# Patient Record
Sex: Male | Born: 1970 | Race: Black or African American | Hispanic: No | Marital: Married | Smoking: Never smoker
Health system: Southern US, Community
[De-identification: ages and names within clinical notes are randomized; demographics above are authoritative.]

## PROBLEM LIST (undated history)

## (undated) DIAGNOSIS — I10 Essential (primary) hypertension: Secondary | ICD-10-CM

## (undated) DIAGNOSIS — D649 Anemia, unspecified: Secondary | ICD-10-CM

## (undated) DIAGNOSIS — R7303 Prediabetes: Secondary | ICD-10-CM

## (undated) DIAGNOSIS — K760 Fatty (change of) liver, not elsewhere classified: Secondary | ICD-10-CM

## (undated) DIAGNOSIS — M1990 Unspecified osteoarthritis, unspecified site: Secondary | ICD-10-CM

## (undated) DIAGNOSIS — K5732 Diverticulitis of large intestine without perforation or abscess without bleeding: Secondary | ICD-10-CM

## (undated) DIAGNOSIS — E785 Hyperlipidemia, unspecified: Secondary | ICD-10-CM

## (undated) DIAGNOSIS — K219 Gastro-esophageal reflux disease without esophagitis: Secondary | ICD-10-CM

## (undated) DIAGNOSIS — Z8719 Personal history of other diseases of the digestive system: Secondary | ICD-10-CM

## (undated) DIAGNOSIS — N912 Amenorrhea, unspecified: Secondary | ICD-10-CM

## (undated) DIAGNOSIS — Z5189 Encounter for other specified aftercare: Secondary | ICD-10-CM

## (undated) HISTORY — PX: VEIN LIGATION AND STRIPPING: SHX2653

## (undated) HISTORY — DX: Encounter for other specified aftercare: Z51.89

## (undated) HISTORY — PX: OTHER SURGICAL HISTORY: SHX169

## (undated) HISTORY — DX: Personal history of other diseases of the digestive system: Z87.19

## (undated) HISTORY — DX: Hyperlipidemia, unspecified: E78.5

## (undated) HISTORY — DX: Unspecified osteoarthritis, unspecified site: M19.90

## (undated) HISTORY — DX: Diverticulitis of large intestine without perforation or abscess without bleeding: K57.32

## (undated) HISTORY — PX: POLYPECTOMY: SHX149

## (undated) HISTORY — PX: UPPER GASTROINTESTINAL ENDOSCOPY: SHX188

## (undated) HISTORY — DX: Essential (primary) hypertension: I10

## (undated) HISTORY — DX: Fatty (change of) liver, not elsewhere classified: K76.0

## (undated) HISTORY — DX: Anemia, unspecified: D64.9

## (undated) HISTORY — DX: Prediabetes: R73.03

## (undated) HISTORY — DX: Amenorrhea, unspecified: N91.2

## (undated) HISTORY — DX: Gastro-esophageal reflux disease without esophagitis: K21.9

## (undated) HISTORY — PX: WISDOM TOOTH EXTRACTION: SHX21

---

## 1996-05-03 HISTORY — PX: KNEE SURGERY: SHX244

## 2003-05-04 HISTORY — PX: OTHER SURGICAL HISTORY: SHX169

## 2006-05-03 HISTORY — PX: CHOLECYSTECTOMY: SHX55

## 2008-05-31 ENCOUNTER — Inpatient Hospital Stay (HOSPITAL_COMMUNITY): Admission: EM | Admit: 2008-05-31 | Discharge: 2008-06-02 | Payer: Self-pay | Admitting: Emergency Medicine

## 2008-05-31 ENCOUNTER — Encounter (INDEPENDENT_AMBULATORY_CARE_PROVIDER_SITE_OTHER): Payer: Self-pay | Admitting: Internal Medicine

## 2008-05-31 ENCOUNTER — Ambulatory Visit: Payer: Self-pay | Admitting: Internal Medicine

## 2008-06-23 ENCOUNTER — Emergency Department (HOSPITAL_COMMUNITY): Admission: EM | Admit: 2008-06-23 | Discharge: 2008-06-23 | Payer: Self-pay | Admitting: Emergency Medicine

## 2008-07-08 HISTORY — PX: ESOPHAGOGASTRODUODENOSCOPY: SHX1529

## 2010-04-23 ENCOUNTER — Emergency Department (HOSPITAL_COMMUNITY)
Admission: EM | Admit: 2010-04-23 | Discharge: 2010-04-24 | Payer: Self-pay | Source: Home / Self Care | Admitting: Emergency Medicine

## 2010-07-13 LAB — COMPREHENSIVE METABOLIC PANEL
Albumin: 4.4 g/dL (ref 3.5–5.2)
BUN: 5 mg/dL — ABNORMAL LOW (ref 6–23)
CO2: 29 mEq/L (ref 19–32)
Calcium: 9.5 mg/dL (ref 8.4–10.5)
Chloride: 102 mEq/L (ref 96–112)
GFR calc Af Amer: >60 mL/min (ref >60)
GFR calc non Af Amer: >60 mL/min (ref >60)
Potassium: 3.9 mEq/L (ref 3.5–5.1)
Sodium: 139 mEq/L (ref 135–145)
Total Bilirubin: 0.8 mg/dL (ref 0.3–1.2)

## 2010-07-13 LAB — DIFFERENTIAL
Basophils Absolute: 0 10*3/uL (ref 0.0–0.1)
Basophils Relative: 0 % (ref 0–1)
Eosinophils Absolute: 0.2 10*3/uL (ref 0.0–0.7)
Lymphocytes Relative: 29 % (ref 12–46)
Monocytes Absolute: 0.4 10*3/uL (ref 0.1–1.0)
Neutrophils Relative %: 62 % (ref 43–77)

## 2010-07-13 LAB — CBC
HCT: 46.4 % (ref 39.0–52.0)
MCV: 91.9 fL (ref 78.0–100.0)
RDW: 12.5 % (ref 11.5–15.5)
WBC: 6.8 10*3/uL (ref 4.0–10.5)

## 2010-07-31 IMAGING — CR DG CHEST 2V
2 series · 2 of 2 positions shown · non-contrast
Comparison: None available.

CLINICAL DATA: Chest pain.Fever.

CHEST - 2 VIEW

[w chest pa]
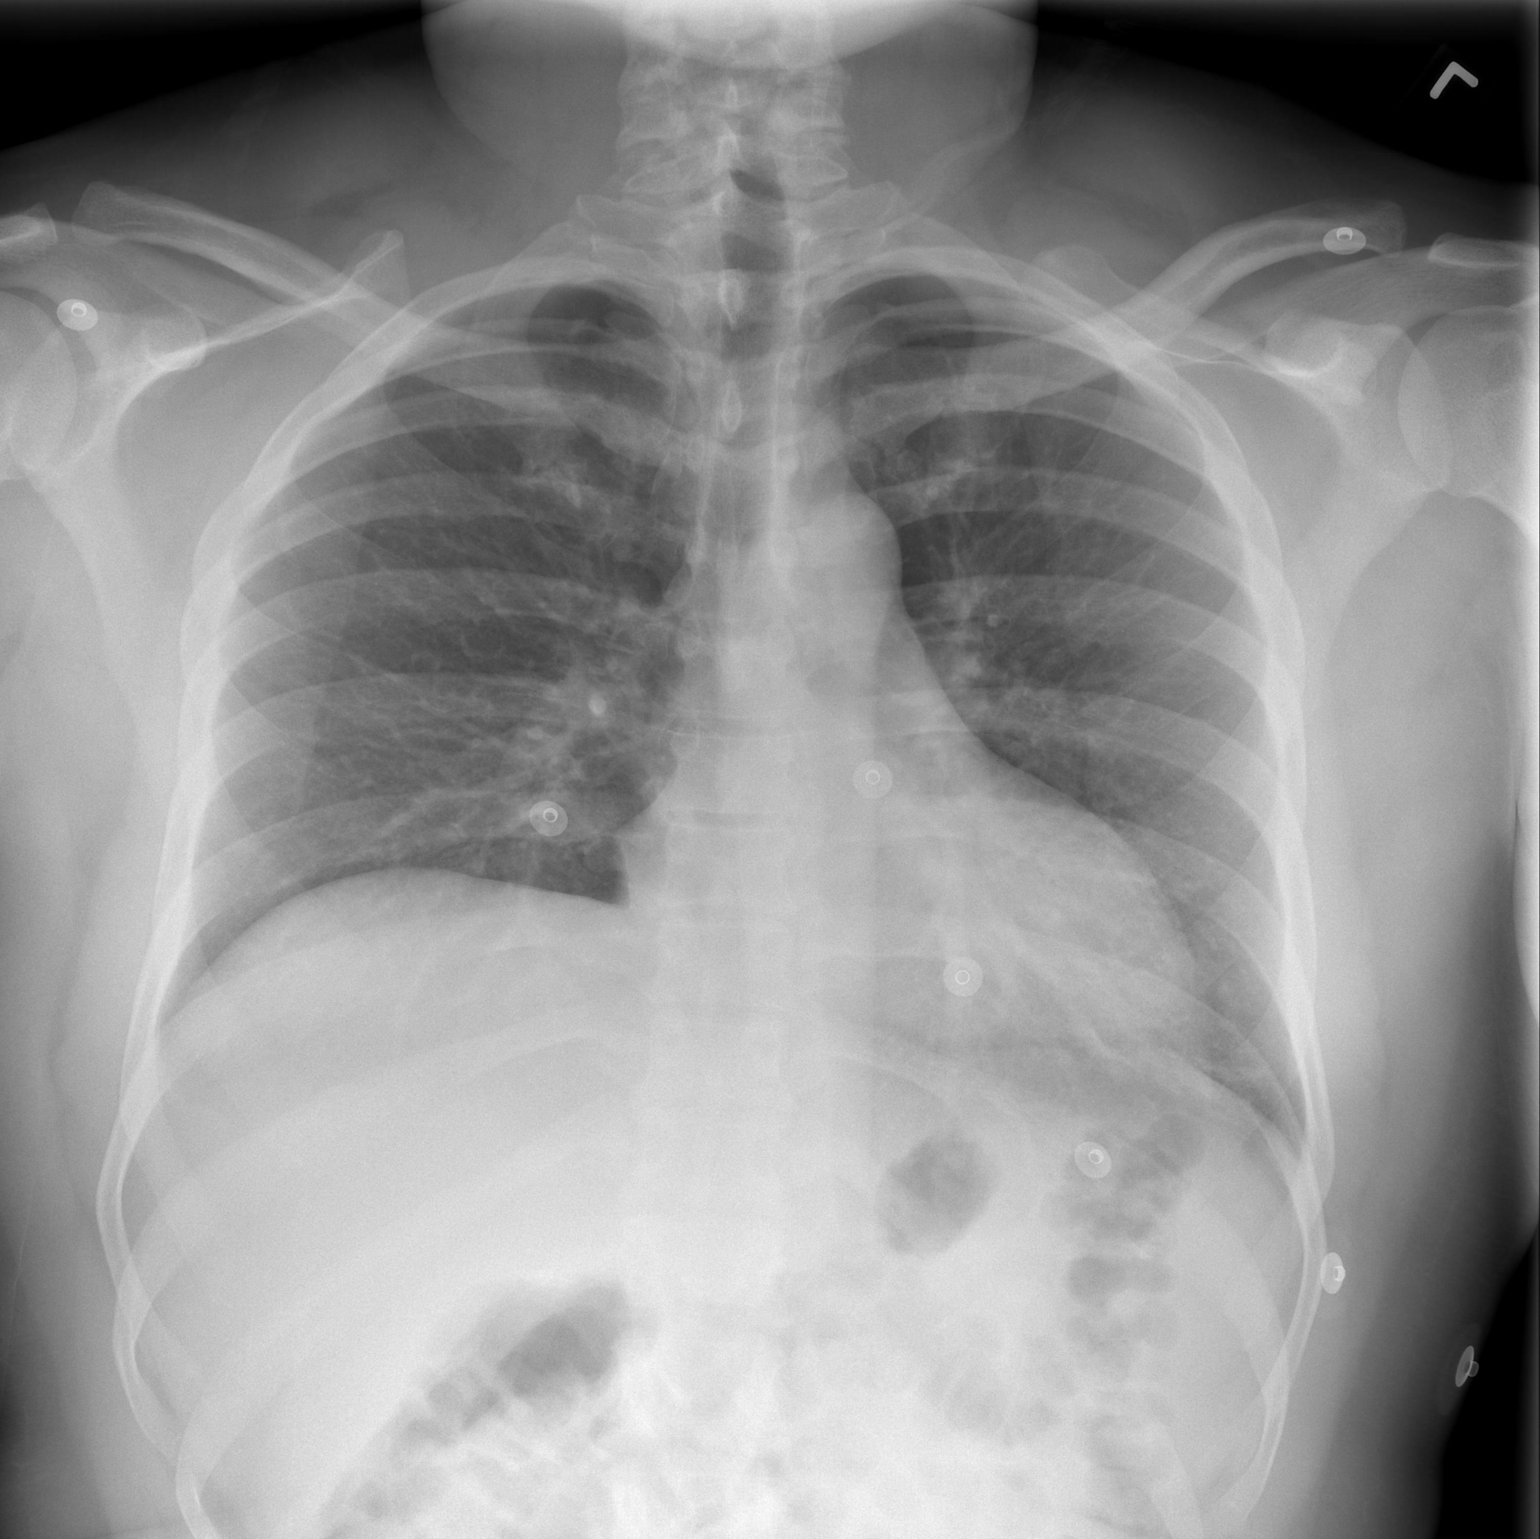

[w chest lat]
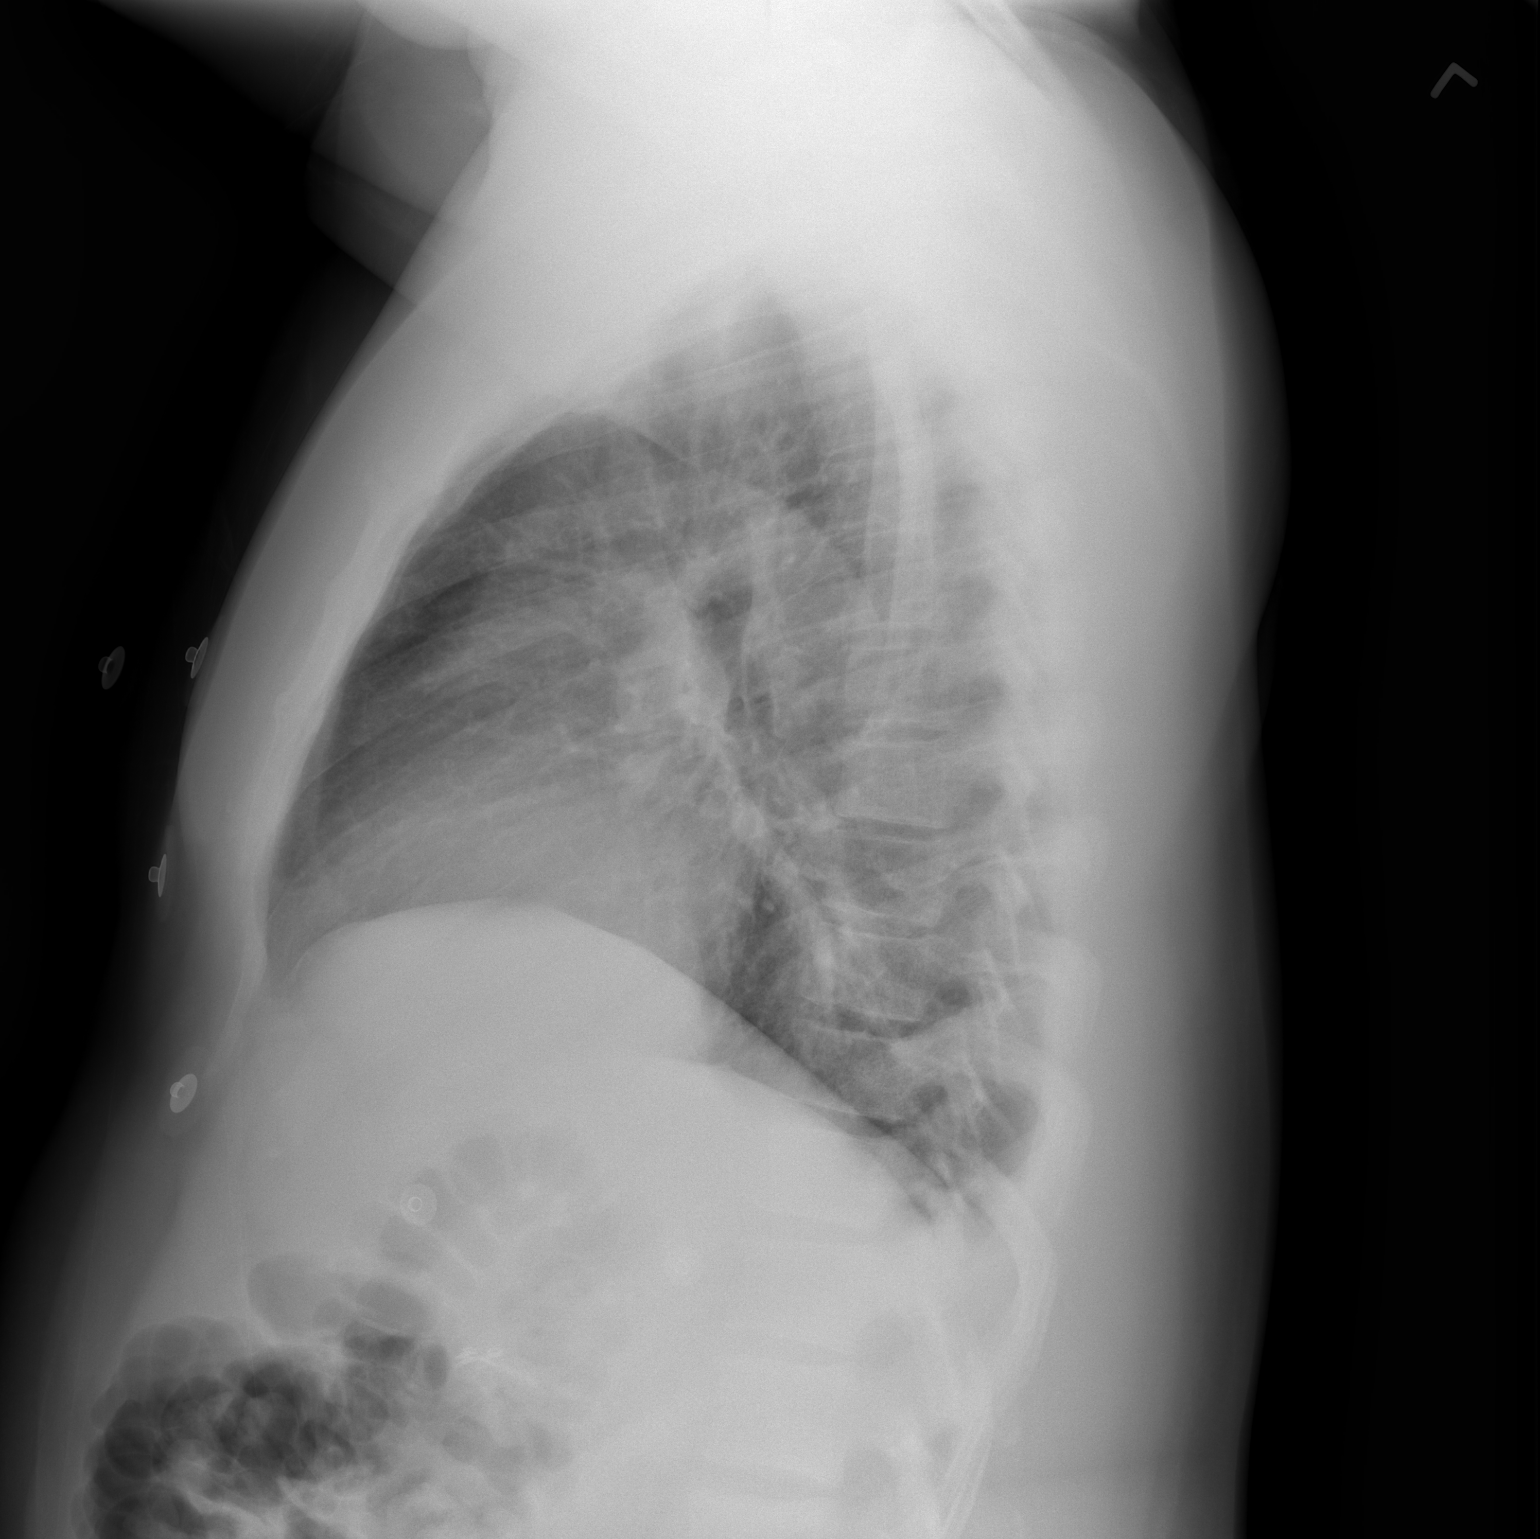

[2 of 2 positions shown; findings below may reference images not displayed]

FINDINGS: The lung volumes are low.  The lungs are clear.  There is
no pleural effusion.  Heart size is normal.
IMPRESSION: No acute disease.

## 2010-07-31 IMAGING — CT CT ANGIO CHEST
2 of 6 series · 20 of 36 positions shown · IV contrast (APPLIED)
Comparison: Chest x-ray 05/30/2008.

CLINICAL DATA: Chest pain.  Rule out pulmonary embolism.

CT ANGIOGRAPHY CHEST
TECHNIQUE: Multidetector CT imaging of the chest using the
standard protocol during bolus administration of intravenous
contrast. Multiplanar reconstructed images including MIPs were
obtained and reviewed to evaluate the vascular anatomy.
Contrast: 80 ml Zmnipaque-G77 IV.

[Series 6: pe 1.0 b40f thins for pacs · axial · 0.68mm/px · z∈[+1324,+1544]mm · 19 of 246 slices shown]
[im 13/246  lung]
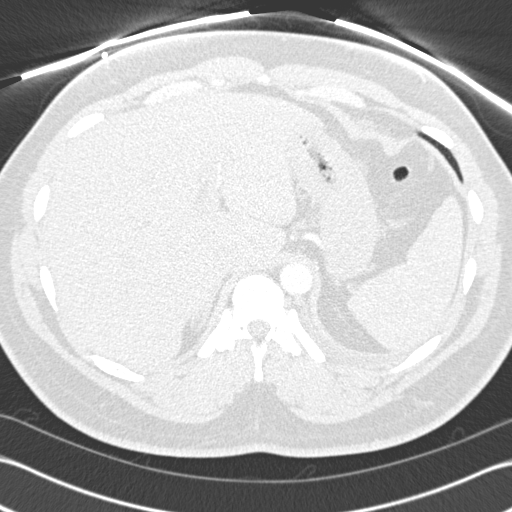
[im 25/246  mediastinal]
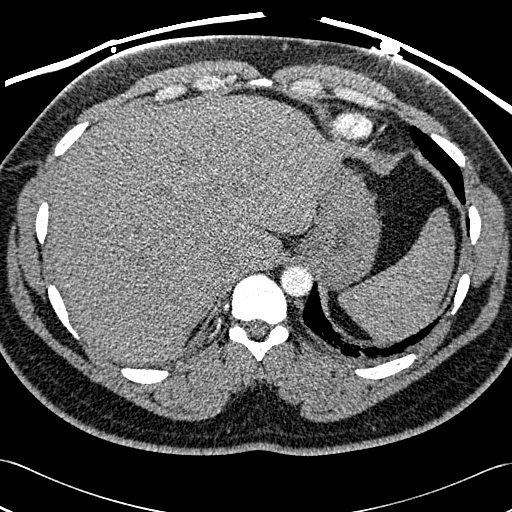
[im 37/246  lung]
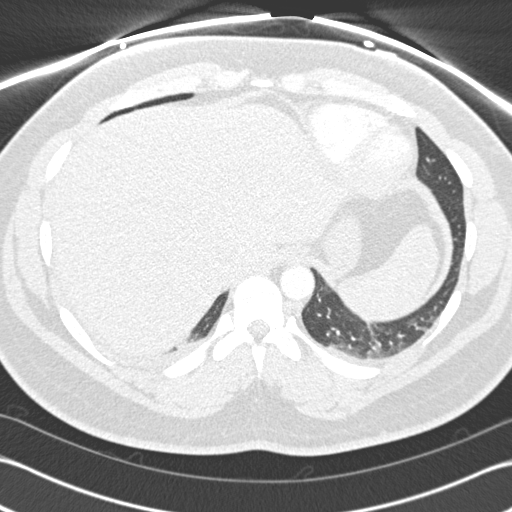
[im 50/246  mediastinal]
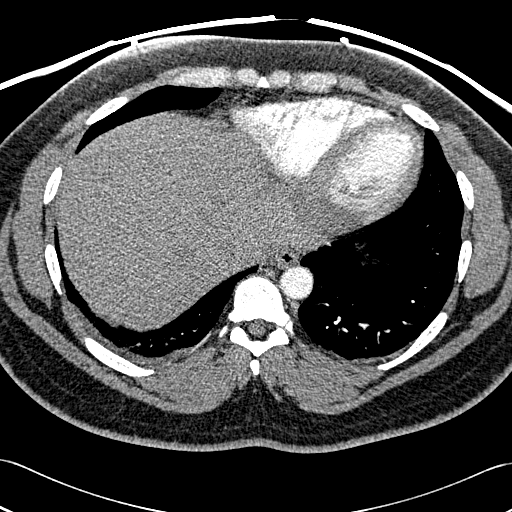
[im 62/246  lung]
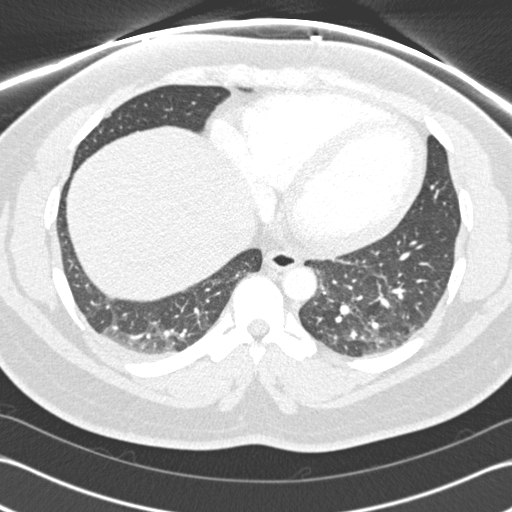
[im 74/246  mediastinal]
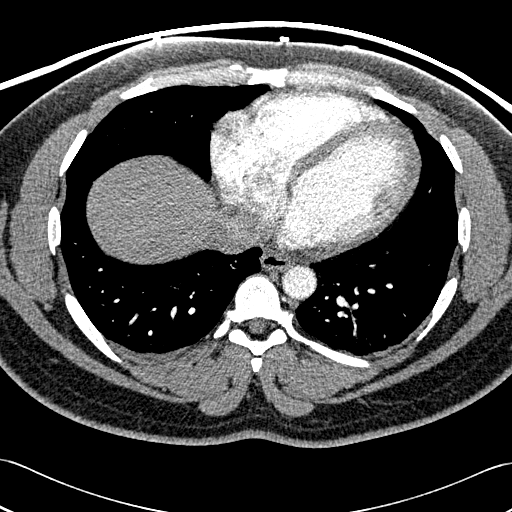
[im 86/246  lung]
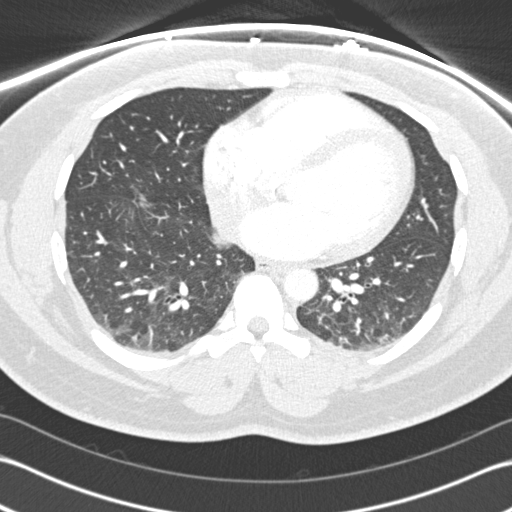
[im 99/246  mediastinal]
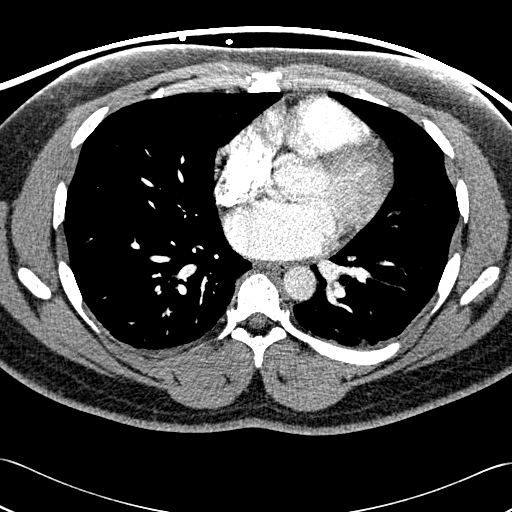
[im 111/246  lung]
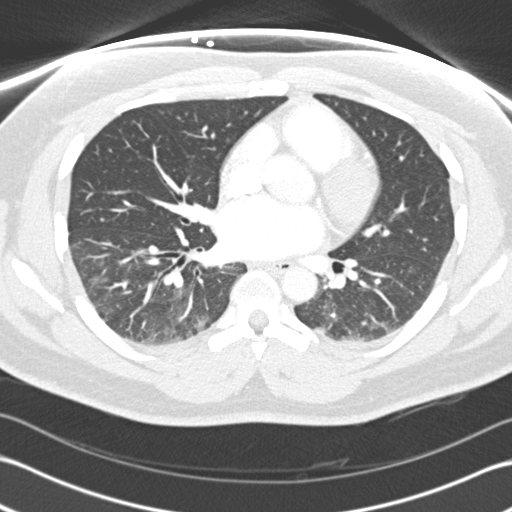
[im 123/246  mediastinal]
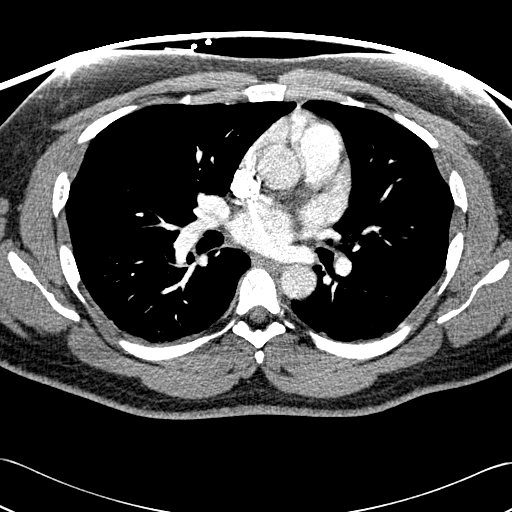
[im 135/246  lung]
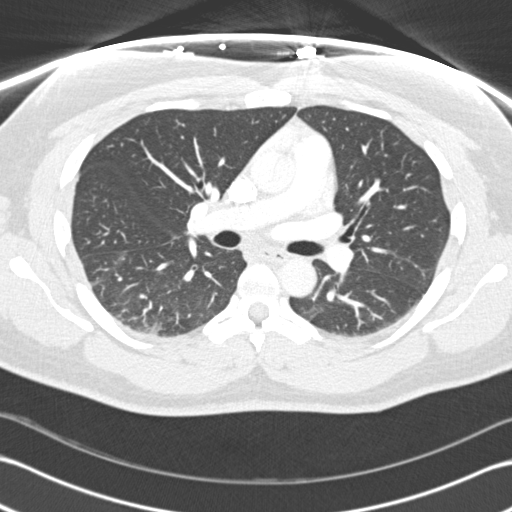
[im 148/246  mediastinal]
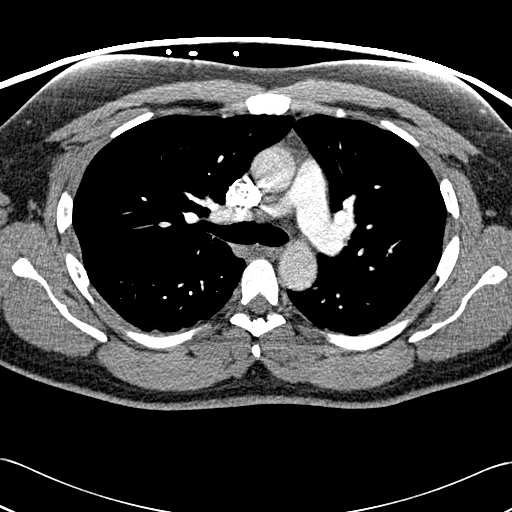
[im 160/246  lung]
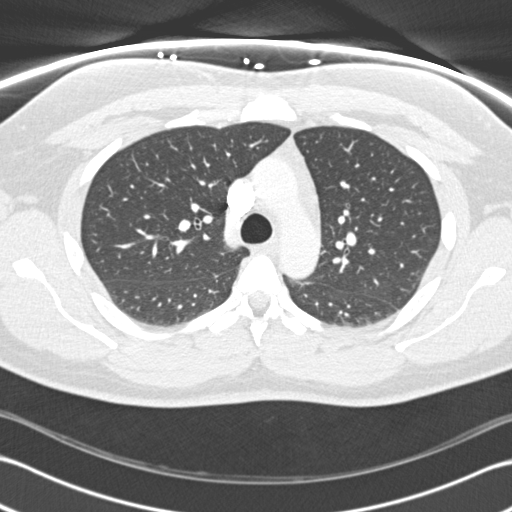
[im 172/246  mediastinal]
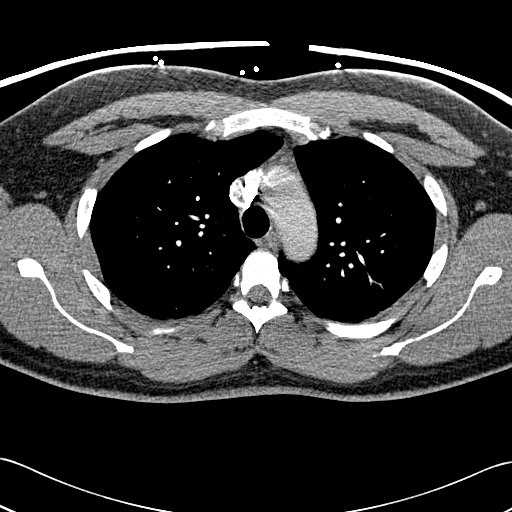
[im 184/246  lung]
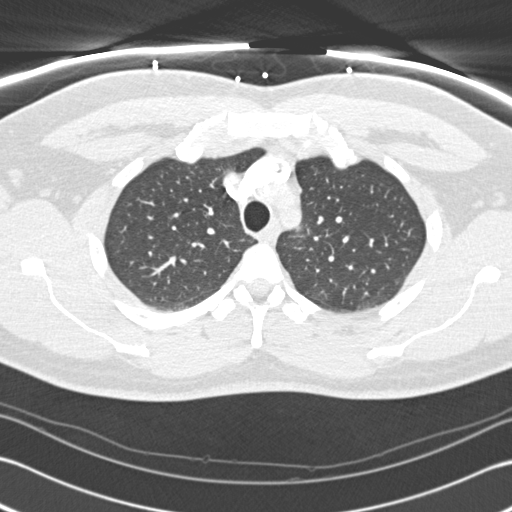
[im 197/246  mediastinal]
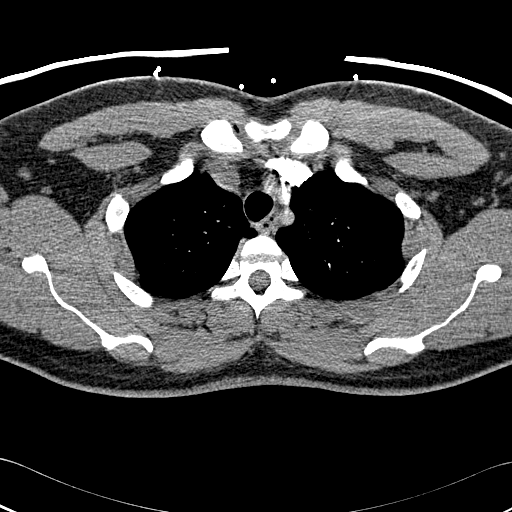
[im 209/246  lung]
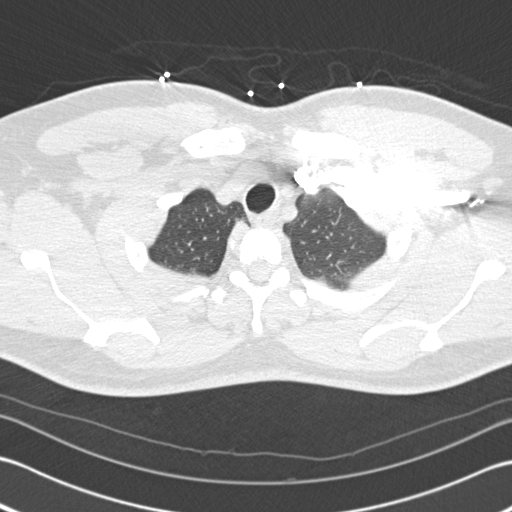
[im 221/246  mediastinal]
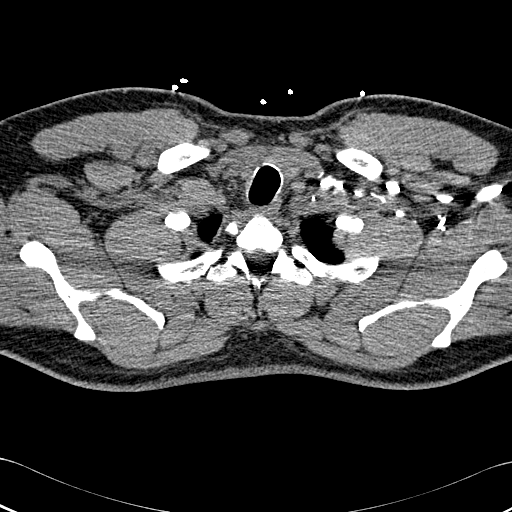
[im 233/246  lung]
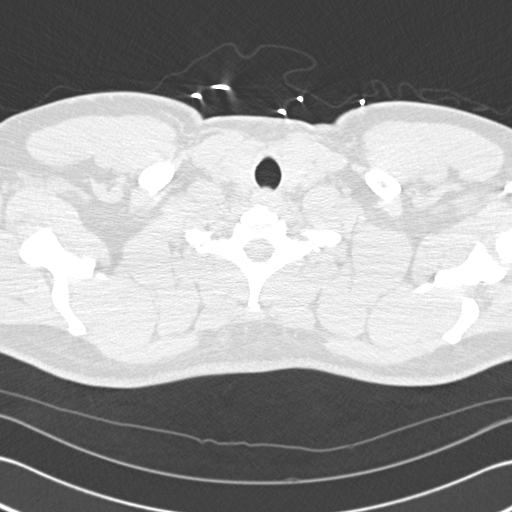

[Series 603: <mpr thick range> · coronal · 0.68mm/px · 1 of 102 slices shown]
[im 51/102  mediastinal]
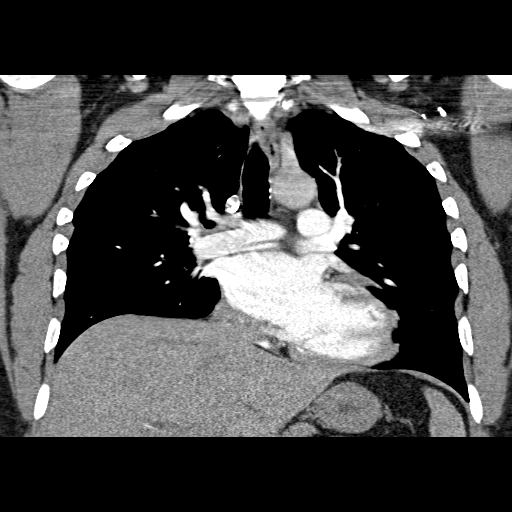

[20 of 36 positions shown; findings below may reference images not displayed]

FINDINGS: Negative for pulmonary embolism.  There is no aortic
dissection or aneurysm.  There is mild dependent atelectasis
bilaterally.  No acute infiltrate.  There is no mass or adenopathy.
IMPRESSION: No acute abnormality.  Negative for pulmonary embolism.

## 2010-08-17 LAB — POCT I-STAT, CHEM 8
BUN: 8 mg/dL (ref 6–23)
Calcium, Ion: 1.12 mmol/L (ref 1.12–1.32)
Chloride: 104 mEq/L (ref 96–112)
Creatinine, Ser: 0.9 mg/dL (ref 0.4–1.5)
HCT: 47 % (ref 39.0–52.0)
Hemoglobin: 16 g/dL (ref 13.0–17.0)
Potassium: 3.6 mEq/L (ref 3.5–5.1)

## 2010-08-17 LAB — COMPREHENSIVE METABOLIC PANEL
ALT: 55 U/L — ABNORMAL HIGH (ref 0–53)
ALT: 57 U/L — ABNORMAL HIGH (ref 0–53)
AST: 34 U/L (ref 0–37)
Albumin: 3.4 g/dL — ABNORMAL LOW (ref 3.5–5.2)
Alkaline Phosphatase: 47 U/L (ref 39–117)
Alkaline Phosphatase: 56 U/L (ref 39–117)
Alkaline Phosphatase: 57 U/L (ref 39–117)
BUN: 8 mg/dL (ref 6–23)
BUN: 9 mg/dL (ref 6–23)
CO2: 27 mEq/L (ref 19–32)
CO2: 28 mEq/L (ref 19–32)
Calcium: 8.5 mg/dL (ref 8.4–10.5)
Calcium: 8.8 mg/dL (ref 8.4–10.5)
Chloride: 100 mEq/L (ref 96–112)
Creatinine, Ser: 0.8 mg/dL (ref 0.4–1.5)
Creatinine, Ser: 0.99 mg/dL (ref 0.4–1.5)
GFR calc non Af Amer: >60 mL/min (ref >60)
Glucose, Bld: 142 mg/dL — ABNORMAL HIGH (ref 70–99)
Glucose, Bld: 84 mg/dL (ref 70–99)
Glucose, Bld: 99 mg/dL (ref 70–99)
Potassium: 3.7 mEq/L (ref 3.5–5.1)
Potassium: 4.1 mEq/L (ref 3.5–5.1)
Sodium: 139 mEq/L (ref 135–145)
Total Bilirubin: 0.6 mg/dL (ref 0.3–1.2)
Total Protein: 5.7 g/dL — ABNORMAL LOW (ref 6.0–8.3)
Total Protein: 5.8 g/dL — ABNORMAL LOW (ref 6.0–8.3)

## 2010-08-17 LAB — MAGNESIUM
Magnesium: 2.3 mg/dL (ref 1.5–2.5)
Magnesium: 2.3 mg/dL (ref 1.5–2.5)
Magnesium: 2.4 mg/dL (ref 1.5–2.5)

## 2010-08-17 LAB — POCT CARDIAC MARKERS
CKMB, poc: <1 ng/mL — ABNORMAL LOW (ref 1.0–8.0)
Troponin i, poc: <0.05 ng/mL (ref 0.00–0.09)

## 2010-08-17 LAB — LIPID PANEL
Cholesterol: 107 mg/dL (ref 0–200)
HDL: 31 mg/dL — ABNORMAL LOW (ref >39)
LDL Cholesterol: 54 mg/dL (ref 0–99)
Total CHOL/HDL Ratio: 3.5 RATIO
Triglycerides: 109 mg/dL (ref <150)
VLDL: 22 mg/dL (ref 0–40)

## 2010-08-17 LAB — CBC
HCT: 42.7 % (ref 39.0–52.0)
HCT: 43.8 % (ref 39.0–52.0)
Hemoglobin: 14.1 g/dL (ref 13.0–17.0)
Hemoglobin: 14.4 g/dL (ref 13.0–17.0)
Hemoglobin: 14.5 g/dL (ref 13.0–17.0)
Hemoglobin: 14.7 g/dL (ref 13.0–17.0)
MCHC: 33.2 g/dL (ref 30.0–36.0)
MCHC: 33.3 g/dL (ref 30.0–36.0)
MCHC: 33.5 g/dL (ref 30.0–36.0)
MCV: 93.4 fL (ref 78.0–100.0)
MCV: 93.7 fL (ref 78.0–100.0)
MCV: 94.9 fL (ref 78.0–100.0)
Platelets: 249 10*3/uL (ref 150–400)
Platelets: 251 10*3/uL (ref 150–400)
RBC: 4.45 MIL/uL (ref 4.22–5.81)
RDW: 12.9 % (ref 11.5–15.5)
WBC: 5.2 10*3/uL (ref 4.0–10.5)
WBC: 5.7 10*3/uL (ref 4.0–10.5)

## 2010-08-17 LAB — DIFFERENTIAL
Basophils Absolute: 0 10*3/uL (ref 0.0–0.1)
Basophils Absolute: 0 10*3/uL (ref 0.0–0.1)
Basophils Absolute: 0 10*3/uL (ref 0.0–0.1)
Basophils Relative: 0 % (ref 0–1)
Basophils Relative: 0 % (ref 0–1)
Eosinophils Absolute: 0.5 10*3/uL (ref 0.0–0.7)
Eosinophils Relative: 7 % — ABNORMAL HIGH (ref 0–5)
Lymphocytes Relative: 40 % (ref 12–46)
Lymphocytes Relative: 40 % (ref 12–46)
Lymphs Abs: 2 10*3/uL (ref 0.7–4.0)
Lymphs Abs: 2.3 10*3/uL (ref 0.7–4.0)
Monocytes Absolute: 0.4 10*3/uL (ref 0.1–1.0)
Monocytes Absolute: 0.4 10*3/uL (ref 0.1–1.0)
Monocytes Absolute: 0.5 10*3/uL (ref 0.1–1.0)
Monocytes Relative: 7 % (ref 3–12)
Monocytes Relative: 7 % (ref 3–12)
Neutro Abs: 2.3 10*3/uL (ref 1.7–7.7)
Neutro Abs: 2.5 10*3/uL (ref 1.7–7.7)
Neutro Abs: 2.5 10*3/uL (ref 1.7–7.7)
Neutro Abs: 3.4 10*3/uL (ref 1.7–7.7)
Neutrophils Relative %: 44 % (ref 43–77)
Neutrophils Relative %: 44 % (ref 43–77)
Neutrophils Relative %: 48 % (ref 43–77)

## 2010-08-17 LAB — RAPID URINE DRUG SCREEN, HOSP PERFORMED
Amphetamines: NOT DETECTED
Tetrahydrocannabinol: NOT DETECTED

## 2010-08-17 LAB — CARDIAC PANEL(CRET KIN+CKTOT+MB+TROPI)
Relative Index: INVALID (ref 0.0–2.5)
Total CK: 64 U/L (ref 7–232)
Troponin I: 0.01 ng/mL (ref 0.00–0.06)

## 2010-08-17 LAB — HEPATIC FUNCTION PANEL
ALT: 48 U/L (ref 0–53)
Alkaline Phosphatase: 52 U/L (ref 39–117)
Bilirubin, Direct: 0.1 mg/dL (ref 0.0–0.3)

## 2010-08-17 LAB — LIPASE, BLOOD
Lipase: 39 U/L (ref 11–59)
Lipase: 74 U/L — ABNORMAL HIGH (ref 11–59)

## 2010-08-18 LAB — COMPREHENSIVE METABOLIC PANEL
ALT: 58 U/L — ABNORMAL HIGH (ref 0–53)
AST: 41 U/L — ABNORMAL HIGH (ref 0–37)
Albumin: 4.2 g/dL (ref 3.5–5.2)
Chloride: 103 mEq/L (ref 96–112)
Creatinine, Ser: 0.8 mg/dL (ref 0.4–1.5)
GFR calc Af Amer: >60 mL/min (ref >60)
Sodium: 137 mEq/L (ref 135–145)
Total Bilirubin: 1.2 mg/dL (ref 0.3–1.2)

## 2010-08-18 LAB — URINALYSIS, ROUTINE W REFLEX MICROSCOPIC
Bilirubin Urine: NEGATIVE
Hgb urine dipstick: NEGATIVE
Specific Gravity, Urine: 1.024 (ref 1.005–1.030)
pH: 6 (ref 5.0–8.0)

## 2010-08-18 LAB — DIFFERENTIAL
Eosinophils Relative: 4 % (ref 0–5)
Lymphocytes Relative: 41 % (ref 12–46)
Lymphs Abs: 1.6 10*3/uL (ref 0.7–4.0)
Monocytes Absolute: 0.5 10*3/uL (ref 0.1–1.0)

## 2010-08-18 LAB — CBC
MCV: 94.8 fL (ref 78.0–100.0)
Platelets: 233 10*3/uL (ref 150–400)
WBC: 4 10*3/uL (ref 4.0–10.5)

## 2010-09-15 NOTE — H&P (Signed)
[name], [name]              ACCOUNT NO.:  [id number]   MEDICAL RECORD NO.:  [id number]          PATIENT TYPE:  INP   LOCATION:  1438                         FACILITY:  [facility]   PHYSICIAN:  Manus Gunning, MD      DATE OF BIRTH:  [birth year]   DATE OF ADMISSION:  05/30/2008  DATE OF DISCHARGE:                              HISTORY & PHYSICAL   ADMITTING SERVICE:  [facility] hospitalist service.   CHIEF COMPLAINT:  Epigastric right upper quadrant abdominal pain x1  week.   HISTORY OF PRESENT ILLNESS:  [name] is a 40 year old African  American male with no significant past medical history.  He presents  with a 1-week history of abdominal pain described above.  He describes  it as sharp and throbbing in nature, radiating towards his back.  Worse  with deep inspiration.  No overt relieving factors.  Continuous with  varying intensities.  He complains of decreased appetite.  No nausea or  vomiting.  No bright red blood per rectum.  No melenic stools.  The pain  is from 7/10 to 10/10.  His lipase at the time of presentation was 110.  Patient has been evaluated for this pain on Friday by his primary care  physician, who then recommended that he go to [facility], where he was admitted for pleuritic chest pain.  He was  discharged from that facility on Tuesday, despite continued symptoms,  with prescriptions for Relafen and Vicodin.   Due to continued discomfort with worsening intensity, he presented to  the emergency department at our facility.  He denies presyncope,  syncope, tinnitus, headache, nausea, vomiting, blurring of vision,  fever.  No sick contacts.  No cough, no expectoration.  No overt chest  pain.  No palpitations.  No PND.  No orthopnea.  He does complain of  shortness of breath with secondary decreased breathing secondary to  pain.  Will proceed with deep inspiration.  No bright red blood per  rectum.  No melenic stool.  No nausea or vomiting.   Positive abdominal  pain radiating to the back.  No other musculoskeletal complaints.  No  dysuria, polyuria, diarrhea, or constipation.  He has a decreased  appetite since this problem started, which was while he was at work.   PAST MEDICAL/SURGICAL HISTORY:  History of cholecystectomy from  cholecystitis.   ALLERGIES:  None.   SOCIAL HISTORY:  Patient has 2 children.  Is married.  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  Denies tobacco, illicits, or alcohol.   FAMILY HISTORY:  Mother has no past medical history.  Father has a  history of diabetes mellitus type 2.   CURRENT MEDICATIONS:  1. Vicodin 5/500 1 tablet q.6h. p.r.n. pain.  2. Relafen 750 mg p.o. b.i.d.   REVIEW OF SYSTEMS:  A 14-point review of systems was performed.  Pertinent positives negative, as described above.   PHYSICAL EXAMINATION:  VITALS AT PRESENTATION:  Temperature 97.8, heart  rate 69, respiratory rate 20, blood pressure 144/92, O2 sat 99% on room  air.  GENERAL: A well-developed and  well-nourished African American male lying  comfortably in bed in no apparent distress.  HEENT:  Normocephalic and atraumatic.  PERRLA.  Mucosa:  No thrush,  erythema, or postnasal drip.  Eyes anicteric.  Extraocular muscles are  intact.  Pupils are equal and reactive to light and accommodation.  NECK:  Supple.  Good range of motion.  No thyromegaly.  Neck veins flat.  CARDIOVASCULAR:  S1 and S2 normal.  Regular rate and rhythm.  No  murmurs, rubs or gallops.  RESPIRATORY:  Air entry is bilaterally equal.  No rales, wheezes, or  rhonchi.  Moderate air movement secondary to splinting.  ABDOMEN:  Soft, nontender.  Positive distention.  Positive bowel sounds.  No organomegaly.  EXTREMITIES:  No clubbing, cyanosis or edema.  Positive bilateral  dorsalis pedis.  CNS:  Alert and oriented x3.  Cranial nerves III-XII are grossly intact.  Power, sensation reflexes B/L symmetrically.  HEME/ONC:  No palpable  lymphadenopathy.  No ecchymosis, bruising, or  petechiae.  No hepatosplenomegaly.  SKIN:  No breakdown, sweilinh, ulcerations or rashes appreciated.   LABORATORY TESTS:  CT angio of the chest demonstrates no acute  abnormality.  Negative for pulmonary embolism.  Mild dependent  atelectasis bilaterally.  No acute infiltrate.   Chest x-ray reviewed myself demonstrates no acute disease.   Hepatic function panel:  AST 26, ALT 48, total protein 6.3, albumin 3.6,  alk phos 52, T bili 0.7, direct bilirubin 0.1, indirect 0.6.  WBCs 7.2,  hemoglobin 14, hematocrit 43.8, platelets 251.  Polymorphs 47.  Sodium  140, potassium 3.6, chloride 104, BUN 8, creatinine 0.9, glucose 90,  ionized calcium 1.12.  Hemoglobin 16, hematocrit 47, myoglobin 47.8.  CK-  MB less than 1.  Troponin I less than 0.05.   ASSESSMENT/PLAN:  Abdominal pain:  The etiology of this appears to be  secondary to pancreatitis.  Patient does not have a history of alcohol  use.  Denies any illicit drug use as well.  Is not on any medications at  home until recently at the time of his admission at [facility].  We will check a mean blood alcohol level.  Also check a urine drug  screen on the patient.  Obtain a CT scan of the abdomen and pelvis with  and without contrast to evaluate for possible pancreatitis.  It is  possible that the patient has a residual biliary pancreatitis secondary  to gallstones in one of the biliary ducts.  At this time, we will treat  the patient as if he has pancreatitis, keep him n.p.o., start fluids at  150 cc/hr and pain control with morphine 2 mg IV q.4h. as well as  antiemetics, Zofran 4 mg IV q.6h. p.r.n.  I have explained this to the  family.  They have expressed understanding and agree with the plan at  this time.  Also, as his pain is located close to his chest, the  description is described as a pleuritic component, we will check serial  cardiac enzymes, although this is very unlikely,  based on his incredibly  low risk for.  Also obtain a 2D echocardiogram, as he does complain of  dyspnea on exertion, and this is relatively peculiar, although most  likely it could be explained by splinting, if no cardiac etiology was  present.  EKG has been reviewed by myself and demonstrates a normal  sinus rhythm, incomplete right bundle branch block.  Possible left  atrial enlargement.  Also, we will check fecal occult blood x2,  start  patient on Protonix 40 mg IV q.12h.  Place patient on systemic  compression devices.      Manus Gunning, MD  Electronically Signed     SP/MEDQ  D:  05/31/2008  T:  05/31/2008  Job:  [id number]

## 2010-09-15 NOTE — Discharge Summary (Signed)
[name], [name]              ACCOUNT NO.:  [id number]   MEDICAL RECORD NO.:  [id number]          PATIENT TYPE:  INP   LOCATION:  1438                         FACILITY:  [facility]   PHYSICIAN:  Hillery Aldo, M.D.   DATE OF BIRTH:  07/27/1970   DATE OF ADMISSION:  05/30/2008  DATE OF DISCHARGE:  06/02/2008                               DISCHARGE SUMMARY   PRIMARY CARE PHYSICIAN:  None.   DISCHARGE DIAGNOSES:  1. Possible mild pancreatitis based on elevated lipase values, CT scan      without evidence of pancreatic edema.  2. Right bundle branch block, incomplete.  3. Right upper quadrant abdominal pain/chest pain of uncertain      etiology.   DISCHARGE MEDICATIONS:  1. Protonix 40 mg daily.  2. Vicodin 5/500 q.6 hours p.r.n. pain.   Note:  The patient was instructed to discontinue treatment with Relafen.   PROCEDURES AND DIAGNOSTIC STUDIES:  1. Chest x-ray on May 30, 2008, showed no acute disease.  2. CT angiogram of the chest on May 31, 2008, showed no acute      abnormality.  Negative for pulmonary embolism.  No infiltrates,      mass or adenopathy.  3. CT scan of the abdomen and pelvis on May 31, 2008, showed no      evidence of acute pancreatitis or other acute abdominal process.      Small bilateral pleural effusions noted.  Free pelvic fluid with no      evidence of focal fluid collection to suggest abscess.  Appendix      was normal in appearance.  4. Two dimensional echocardiogram on May 31, 2008, showed normal      left ventricular systolic function with an ejection fraction      estimated at 55-60%.  No pericardial effusion.   DISCHARGE LABORATORY VALUES:  Sodium was 137, potassium 3.7, chloride  100, bicarb 28, BUN 9, creatinine 0.99, glucose 142.  Liver function  studies were completely within normal limits.  Magnesium was 2.4, lipase  39.  White blood cell count was 5.2, hemoglobin 14.4, hematocrit 42.7,  platelets 254.  Cholesterol was 107,  triglycerides 109, HDL 31, LDL 54.   HOSPITAL COURSE:  1. Right upper quadrant abdominal pain/chest pain:  Source of the      patient's pain complaints are not entirely clear.  He endorsed a      right upper quadrant type pain that radiated across his abdomen and      certainly would be consistent with pancreatitis.  His initial      lipase was slightly elevated at 110.  The patient was kept n.p.o.      and given IV pain medications as needed and his lipase rapidly      normalized.  Despite normalization of his lipase, the patient      continued to complain of right upper quadrant and right-sided      pleuritic chest pain.  Of note, the patient was evaluated at      [facility] prior to his presentation here.  A workup there  had reportedly been negative and he was discharged home with      prescriptions for Relafen and Vicodin.  It is possible that the      patient could have developed some Relafen induced gastritis.  He      was empirically put on proton pump inhibitor therapy.  All      diagnostic testing was unrevealing including comprehensive      laboratory data as well as comprehensive radiographic data.  He had      no evidence of pericarditis on two dimensional echocardiography.      He had no evidence of lung parenchymal abnormalities on CT scanning      of the chest.  There was no pulmonary embolism.  Cardiac markers      were cycled q.8 hours x3 sets and were completely negative.  The      patient's diet was advanced on June 01, 2008, and he tolerated      the advancement without any change in his pain.  His lipase has      remained normal.  At this point, the patient was advised that the      exact source of his pain could not be determined.  We recommended      that he continue to take Protonix for the possibility of Relafen      induced gastritis and continue to treat his symptoms with Vicodin      until they resolve.  If symptoms do not resolve or get worse  over      time, he is encouraged to follow up with a primary care physician.      The patient does not currently have a primary care physician and      attends an urgent care center when he has any medical problem.  He      was advised to call [phone] for a general medical referral or to      call Dr. Mikeal Hawthorne at [phone] since Dr. Mikeal Hawthorne is accepting new      patients.  Of note, the patient's ongoing pain is out of proportion      to any clinical findings and could simply be a functional issue or      perhaps malingering.  The patient did request an absentee note for      work and wanted to know how long he should stay out of work.  The      patient was informed that there was no medical reason to stay out      of work and he can return when his symptoms are adequately      controlled to allow him to work.  2. EKG abnormalities/incomplete right bundle branch block.  The      patient did have a 12-lead EKG done on admission.  There is      possible left atrial enlargement and a noted incomplete right      bundle branch block.  A two dimensional echocardiogram did not show      any evidence of left ventricular systolic dysfunction.  There were      no regional wall motion abnormalities documented.  Left ventricular      wall thickness was of normal size.  There were no valvular      abnormalities and no pericardial effusion.  Again, cardiac markers      were cycled q.8 hours x3 and were completely negative.  No further      diagnostic  evaluation is planned at this time.   DISPOSITION:  The patient is medically stable and will be discharged  home.  Again, he is advised to obtain a primary care physician and to  follow up for any nonresolution or worsening of symptoms.   Time spent coordinating care for discharge and discharge instructions  equals 35 minutes.      Hillery Aldo, M.D.  Electronically Signed     CR/MEDQ  D:  06/02/2008  T:  06/02/2008  Job:  [id number]

## 2011-01-14 HISTORY — PX: COLONOSCOPY: SHX174

## 2016-05-07 DIAGNOSIS — M5126 Other intervertebral disc displacement, lumbar region: Secondary | ICD-10-CM | POA: Diagnosis not present

## 2016-05-07 DIAGNOSIS — G894 Chronic pain syndrome: Secondary | ICD-10-CM | POA: Diagnosis not present

## 2016-05-07 DIAGNOSIS — Z79891 Long term (current) use of opiate analgesic: Secondary | ICD-10-CM | POA: Diagnosis not present

## 2016-05-07 DIAGNOSIS — M545 Low back pain: Secondary | ICD-10-CM | POA: Diagnosis not present

## 2016-05-07 DIAGNOSIS — Z79899 Other long term (current) drug therapy: Secondary | ICD-10-CM | POA: Diagnosis not present

## 2016-05-12 DIAGNOSIS — M5416 Radiculopathy, lumbar region: Secondary | ICD-10-CM | POA: Diagnosis not present

## 2016-05-26 DIAGNOSIS — Z6832 Body mass index (BMI) 32.0-32.9, adult: Secondary | ICD-10-CM | POA: Diagnosis not present

## 2016-05-26 DIAGNOSIS — M5126 Other intervertebral disc displacement, lumbar region: Secondary | ICD-10-CM | POA: Diagnosis not present

## 2016-05-26 DIAGNOSIS — M5416 Radiculopathy, lumbar region: Secondary | ICD-10-CM | POA: Diagnosis not present

## 2016-05-26 DIAGNOSIS — M545 Low back pain: Secondary | ICD-10-CM | POA: Diagnosis not present

## 2016-06-16 DIAGNOSIS — G894 Chronic pain syndrome: Secondary | ICD-10-CM | POA: Diagnosis not present

## 2016-06-16 DIAGNOSIS — M5416 Radiculopathy, lumbar region: Secondary | ICD-10-CM | POA: Diagnosis not present

## 2016-06-16 DIAGNOSIS — M545 Low back pain: Secondary | ICD-10-CM | POA: Diagnosis not present

## 2016-06-16 DIAGNOSIS — M5126 Other intervertebral disc displacement, lumbar region: Secondary | ICD-10-CM | POA: Diagnosis not present

## 2016-07-13 DIAGNOSIS — M4716 Other spondylosis with myelopathy, lumbar region: Secondary | ICD-10-CM | POA: Diagnosis not present

## 2016-07-13 DIAGNOSIS — M5106 Intervertebral disc disorders with myelopathy, lumbar region: Secondary | ICD-10-CM | POA: Diagnosis not present

## 2016-07-13 DIAGNOSIS — G894 Chronic pain syndrome: Secondary | ICD-10-CM | POA: Diagnosis not present

## 2016-08-10 DIAGNOSIS — Z4689 Encounter for fitting and adjustment of other specified devices: Secondary | ICD-10-CM | POA: Diagnosis not present

## 2016-08-10 DIAGNOSIS — M5106 Intervertebral disc disorders with myelopathy, lumbar region: Secondary | ICD-10-CM | POA: Diagnosis not present

## 2016-08-10 DIAGNOSIS — M549 Dorsalgia, unspecified: Secondary | ICD-10-CM | POA: Diagnosis not present

## 2016-08-10 DIAGNOSIS — M4716 Other spondylosis with myelopathy, lumbar region: Secondary | ICD-10-CM | POA: Diagnosis not present

## 2016-08-10 DIAGNOSIS — M545 Low back pain: Secondary | ICD-10-CM | POA: Diagnosis not present

## 2016-08-18 DIAGNOSIS — M4716 Other spondylosis with myelopathy, lumbar region: Secondary | ICD-10-CM

## 2016-08-18 HISTORY — DX: Other spondylosis with myelopathy, lumbar region: M47.16

## 2016-08-20 DIAGNOSIS — M4716 Other spondylosis with myelopathy, lumbar region: Secondary | ICD-10-CM | POA: Diagnosis not present

## 2016-08-20 DIAGNOSIS — Z5189 Encounter for other specified aftercare: Secondary | ICD-10-CM | POA: Diagnosis not present

## 2016-08-24 DIAGNOSIS — I10 Essential (primary) hypertension: Secondary | ICD-10-CM | POA: Diagnosis present

## 2016-08-24 DIAGNOSIS — M4726 Other spondylosis with radiculopathy, lumbar region: Secondary | ICD-10-CM | POA: Diagnosis present

## 2016-08-24 DIAGNOSIS — T84296A Other mechanical complication of internal fixation device of vertebrae, initial encounter: Secondary | ICD-10-CM | POA: Diagnosis not present

## 2016-08-24 DIAGNOSIS — M5106 Intervertebral disc disorders with myelopathy, lumbar region: Secondary | ICD-10-CM | POA: Diagnosis not present

## 2016-08-24 DIAGNOSIS — M4716 Other spondylosis with myelopathy, lumbar region: Secondary | ICD-10-CM | POA: Diagnosis not present

## 2016-08-24 DIAGNOSIS — M5126 Other intervertebral disc displacement, lumbar region: Secondary | ICD-10-CM | POA: Diagnosis present

## 2016-08-24 DIAGNOSIS — M961 Postlaminectomy syndrome, not elsewhere classified: Secondary | ICD-10-CM | POA: Diagnosis present

## 2016-08-24 DIAGNOSIS — Z981 Arthrodesis status: Secondary | ICD-10-CM | POA: Diagnosis not present

## 2016-09-21 DIAGNOSIS — M961 Postlaminectomy syndrome, not elsewhere classified: Secondary | ICD-10-CM | POA: Diagnosis not present

## 2016-10-22 DIAGNOSIS — Z79891 Long term (current) use of opiate analgesic: Secondary | ICD-10-CM | POA: Diagnosis not present

## 2016-10-22 DIAGNOSIS — G894 Chronic pain syndrome: Secondary | ICD-10-CM | POA: Diagnosis not present

## 2016-10-22 DIAGNOSIS — Z79899 Other long term (current) drug therapy: Secondary | ICD-10-CM | POA: Diagnosis not present

## 2016-11-17 DIAGNOSIS — Z79891 Long term (current) use of opiate analgesic: Secondary | ICD-10-CM | POA: Diagnosis not present

## 2016-11-17 DIAGNOSIS — G894 Chronic pain syndrome: Secondary | ICD-10-CM | POA: Diagnosis not present

## 2016-11-17 DIAGNOSIS — Z79899 Other long term (current) drug therapy: Secondary | ICD-10-CM | POA: Diagnosis not present

## 2016-11-17 DIAGNOSIS — M961 Postlaminectomy syndrome, not elsewhere classified: Secondary | ICD-10-CM | POA: Diagnosis not present

## 2016-12-14 DIAGNOSIS — G894 Chronic pain syndrome: Secondary | ICD-10-CM | POA: Diagnosis not present

## 2016-12-14 DIAGNOSIS — Z79891 Long term (current) use of opiate analgesic: Secondary | ICD-10-CM | POA: Diagnosis not present

## 2016-12-14 DIAGNOSIS — Z79899 Other long term (current) drug therapy: Secondary | ICD-10-CM | POA: Diagnosis not present

## 2017-01-17 DIAGNOSIS — M5106 Intervertebral disc disorders with myelopathy, lumbar region: Secondary | ICD-10-CM | POA: Diagnosis not present

## 2017-01-17 DIAGNOSIS — G894 Chronic pain syndrome: Secondary | ICD-10-CM | POA: Diagnosis not present

## 2017-01-17 DIAGNOSIS — M545 Low back pain: Secondary | ICD-10-CM | POA: Diagnosis not present

## 2017-01-31 DIAGNOSIS — M961 Postlaminectomy syndrome, not elsewhere classified: Secondary | ICD-10-CM | POA: Diagnosis not present

## 2017-01-31 DIAGNOSIS — G894 Chronic pain syndrome: Secondary | ICD-10-CM | POA: Diagnosis not present

## 2017-01-31 DIAGNOSIS — M5416 Radiculopathy, lumbar region: Secondary | ICD-10-CM | POA: Diagnosis not present

## 2017-02-16 DIAGNOSIS — M5106 Intervertebral disc disorders with myelopathy, lumbar region: Secondary | ICD-10-CM | POA: Diagnosis not present

## 2017-02-16 DIAGNOSIS — M4326 Fusion of spine, lumbar region: Secondary | ICD-10-CM | POA: Diagnosis not present

## 2017-02-16 DIAGNOSIS — G894 Chronic pain syndrome: Secondary | ICD-10-CM | POA: Diagnosis not present

## 2017-02-16 DIAGNOSIS — M545 Low back pain: Secondary | ICD-10-CM | POA: Diagnosis not present

## 2017-02-16 DIAGNOSIS — Z23 Encounter for immunization: Secondary | ICD-10-CM | POA: Diagnosis not present

## 2017-02-18 DIAGNOSIS — M545 Low back pain: Secondary | ICD-10-CM | POA: Diagnosis not present

## 2017-02-18 DIAGNOSIS — Z6830 Body mass index (BMI) 30.0-30.9, adult: Secondary | ICD-10-CM | POA: Diagnosis not present

## 2017-02-18 DIAGNOSIS — Z7689 Persons encountering health services in other specified circumstances: Secondary | ICD-10-CM | POA: Diagnosis not present

## 2017-02-18 DIAGNOSIS — E669 Obesity, unspecified: Secondary | ICD-10-CM | POA: Diagnosis not present

## 2017-02-18 DIAGNOSIS — I10 Essential (primary) hypertension: Secondary | ICD-10-CM | POA: Diagnosis not present

## 2017-02-18 DIAGNOSIS — Z1389 Encounter for screening for other disorder: Secondary | ICD-10-CM | POA: Diagnosis not present

## 2017-03-07 DIAGNOSIS — M961 Postlaminectomy syndrome, not elsewhere classified: Secondary | ICD-10-CM | POA: Diagnosis not present

## 2017-03-07 DIAGNOSIS — G894 Chronic pain syndrome: Secondary | ICD-10-CM | POA: Diagnosis not present

## 2017-03-07 DIAGNOSIS — M5416 Radiculopathy, lumbar region: Secondary | ICD-10-CM | POA: Diagnosis not present

## 2017-03-07 DIAGNOSIS — M533 Sacrococcygeal disorders, not elsewhere classified: Secondary | ICD-10-CM | POA: Diagnosis not present

## 2017-03-07 DIAGNOSIS — M461 Sacroiliitis, not elsewhere classified: Secondary | ICD-10-CM | POA: Diagnosis not present

## 2017-03-25 DIAGNOSIS — R7301 Impaired fasting glucose: Secondary | ICD-10-CM | POA: Diagnosis not present

## 2017-03-25 DIAGNOSIS — Z6832 Body mass index (BMI) 32.0-32.9, adult: Secondary | ICD-10-CM | POA: Diagnosis not present

## 2017-03-25 DIAGNOSIS — I10 Essential (primary) hypertension: Secondary | ICD-10-CM | POA: Diagnosis not present

## 2017-04-04 DIAGNOSIS — M5416 Radiculopathy, lumbar region: Secondary | ICD-10-CM | POA: Diagnosis not present

## 2017-04-04 DIAGNOSIS — M533 Sacrococcygeal disorders, not elsewhere classified: Secondary | ICD-10-CM | POA: Diagnosis not present

## 2017-04-04 DIAGNOSIS — M961 Postlaminectomy syndrome, not elsewhere classified: Secondary | ICD-10-CM | POA: Diagnosis not present

## 2017-04-04 DIAGNOSIS — G894 Chronic pain syndrome: Secondary | ICD-10-CM | POA: Diagnosis not present

## 2017-04-04 DIAGNOSIS — M461 Sacroiliitis, not elsewhere classified: Secondary | ICD-10-CM | POA: Diagnosis not present

## 2017-04-04 DIAGNOSIS — K5909 Other constipation: Secondary | ICD-10-CM | POA: Diagnosis not present

## 2017-04-13 DIAGNOSIS — G894 Chronic pain syndrome: Secondary | ICD-10-CM | POA: Diagnosis not present

## 2017-04-13 DIAGNOSIS — K5909 Other constipation: Secondary | ICD-10-CM | POA: Diagnosis not present

## 2017-04-13 DIAGNOSIS — M5416 Radiculopathy, lumbar region: Secondary | ICD-10-CM | POA: Diagnosis not present

## 2017-04-13 DIAGNOSIS — M461 Sacroiliitis, not elsewhere classified: Secondary | ICD-10-CM | POA: Diagnosis not present

## 2017-04-13 DIAGNOSIS — M533 Sacrococcygeal disorders, not elsewhere classified: Secondary | ICD-10-CM | POA: Diagnosis not present

## 2017-04-13 DIAGNOSIS — M961 Postlaminectomy syndrome, not elsewhere classified: Secondary | ICD-10-CM | POA: Diagnosis not present

## 2017-05-05 DIAGNOSIS — M5416 Radiculopathy, lumbar region: Secondary | ICD-10-CM | POA: Diagnosis not present

## 2017-05-05 DIAGNOSIS — M961 Postlaminectomy syndrome, not elsewhere classified: Secondary | ICD-10-CM | POA: Diagnosis not present

## 2017-05-05 DIAGNOSIS — M533 Sacrococcygeal disorders, not elsewhere classified: Secondary | ICD-10-CM | POA: Diagnosis not present

## 2017-05-05 DIAGNOSIS — M461 Sacroiliitis, not elsewhere classified: Secondary | ICD-10-CM | POA: Diagnosis not present

## 2017-05-05 DIAGNOSIS — K5909 Other constipation: Secondary | ICD-10-CM | POA: Diagnosis not present

## 2017-05-05 DIAGNOSIS — G894 Chronic pain syndrome: Secondary | ICD-10-CM | POA: Diagnosis not present

## 2017-06-02 DIAGNOSIS — M961 Postlaminectomy syndrome, not elsewhere classified: Secondary | ICD-10-CM | POA: Diagnosis not present

## 2017-06-02 DIAGNOSIS — M5416 Radiculopathy, lumbar region: Secondary | ICD-10-CM | POA: Diagnosis not present

## 2017-06-02 DIAGNOSIS — M533 Sacrococcygeal disorders, not elsewhere classified: Secondary | ICD-10-CM | POA: Diagnosis not present

## 2017-06-02 DIAGNOSIS — G894 Chronic pain syndrome: Secondary | ICD-10-CM | POA: Diagnosis not present

## 2017-06-02 DIAGNOSIS — M461 Sacroiliitis, not elsewhere classified: Secondary | ICD-10-CM | POA: Diagnosis not present

## 2017-06-02 DIAGNOSIS — K5909 Other constipation: Secondary | ICD-10-CM | POA: Diagnosis not present

## 2017-06-22 DIAGNOSIS — G894 Chronic pain syndrome: Secondary | ICD-10-CM | POA: Diagnosis not present

## 2017-06-22 DIAGNOSIS — M461 Sacroiliitis, not elsewhere classified: Secondary | ICD-10-CM | POA: Diagnosis not present

## 2017-06-22 DIAGNOSIS — M5416 Radiculopathy, lumbar region: Secondary | ICD-10-CM | POA: Diagnosis not present

## 2017-06-22 DIAGNOSIS — M961 Postlaminectomy syndrome, not elsewhere classified: Secondary | ICD-10-CM | POA: Diagnosis not present

## 2017-06-22 DIAGNOSIS — M533 Sacrococcygeal disorders, not elsewhere classified: Secondary | ICD-10-CM | POA: Diagnosis not present

## 2017-06-22 DIAGNOSIS — K5909 Other constipation: Secondary | ICD-10-CM | POA: Diagnosis not present

## 2017-06-24 DIAGNOSIS — R7303 Prediabetes: Secondary | ICD-10-CM | POA: Diagnosis not present

## 2017-06-24 DIAGNOSIS — Z6830 Body mass index (BMI) 30.0-30.9, adult: Secondary | ICD-10-CM | POA: Diagnosis not present

## 2017-06-24 DIAGNOSIS — I10 Essential (primary) hypertension: Secondary | ICD-10-CM | POA: Diagnosis not present

## 2017-06-24 DIAGNOSIS — M545 Low back pain: Secondary | ICD-10-CM | POA: Diagnosis not present

## 2017-07-06 DIAGNOSIS — G894 Chronic pain syndrome: Secondary | ICD-10-CM | POA: Diagnosis not present

## 2017-07-06 DIAGNOSIS — M461 Sacroiliitis, not elsewhere classified: Secondary | ICD-10-CM | POA: Diagnosis not present

## 2017-07-06 DIAGNOSIS — M5416 Radiculopathy, lumbar region: Secondary | ICD-10-CM | POA: Diagnosis not present

## 2017-07-06 DIAGNOSIS — M961 Postlaminectomy syndrome, not elsewhere classified: Secondary | ICD-10-CM | POA: Diagnosis not present

## 2017-07-06 DIAGNOSIS — K5909 Other constipation: Secondary | ICD-10-CM | POA: Diagnosis not present

## 2017-07-06 DIAGNOSIS — M533 Sacrococcygeal disorders, not elsewhere classified: Secondary | ICD-10-CM | POA: Diagnosis not present

## 2017-07-28 DIAGNOSIS — Z6829 Body mass index (BMI) 29.0-29.9, adult: Secondary | ICD-10-CM | POA: Diagnosis not present

## 2017-07-28 DIAGNOSIS — J209 Acute bronchitis, unspecified: Secondary | ICD-10-CM | POA: Diagnosis not present

## 2017-08-11 DIAGNOSIS — M461 Sacroiliitis, not elsewhere classified: Secondary | ICD-10-CM | POA: Diagnosis not present

## 2017-08-11 DIAGNOSIS — K5909 Other constipation: Secondary | ICD-10-CM | POA: Diagnosis not present

## 2017-08-11 DIAGNOSIS — M961 Postlaminectomy syndrome, not elsewhere classified: Secondary | ICD-10-CM | POA: Diagnosis not present

## 2017-08-11 DIAGNOSIS — G894 Chronic pain syndrome: Secondary | ICD-10-CM | POA: Diagnosis not present

## 2017-08-11 DIAGNOSIS — M533 Sacrococcygeal disorders, not elsewhere classified: Secondary | ICD-10-CM | POA: Diagnosis not present

## 2017-08-11 DIAGNOSIS — M5416 Radiculopathy, lumbar region: Secondary | ICD-10-CM | POA: Diagnosis not present

## 2017-08-19 DIAGNOSIS — R109 Unspecified abdominal pain: Secondary | ICD-10-CM | POA: Diagnosis not present

## 2017-08-19 DIAGNOSIS — S39012A Strain of muscle, fascia and tendon of lower back, initial encounter: Secondary | ICD-10-CM | POA: Diagnosis not present

## 2017-08-19 DIAGNOSIS — X500XXA Overexertion from strenuous movement or load, initial encounter: Secondary | ICD-10-CM | POA: Diagnosis not present

## 2017-08-19 DIAGNOSIS — R1032 Left lower quadrant pain: Secondary | ICD-10-CM | POA: Diagnosis not present

## 2017-08-19 DIAGNOSIS — Y999 Unspecified external cause status: Secondary | ICD-10-CM | POA: Diagnosis not present

## 2017-09-08 DIAGNOSIS — M961 Postlaminectomy syndrome, not elsewhere classified: Secondary | ICD-10-CM | POA: Diagnosis not present

## 2017-09-08 DIAGNOSIS — M461 Sacroiliitis, not elsewhere classified: Secondary | ICD-10-CM | POA: Diagnosis not present

## 2017-09-08 DIAGNOSIS — M533 Sacrococcygeal disorders, not elsewhere classified: Secondary | ICD-10-CM | POA: Diagnosis not present

## 2017-09-08 DIAGNOSIS — G894 Chronic pain syndrome: Secondary | ICD-10-CM | POA: Diagnosis not present

## 2017-09-08 DIAGNOSIS — K5909 Other constipation: Secondary | ICD-10-CM | POA: Diagnosis not present

## 2017-09-08 DIAGNOSIS — M5416 Radiculopathy, lumbar region: Secondary | ICD-10-CM | POA: Diagnosis not present

## 2017-09-21 DIAGNOSIS — M461 Sacroiliitis, not elsewhere classified: Secondary | ICD-10-CM | POA: Diagnosis not present

## 2017-09-21 DIAGNOSIS — M961 Postlaminectomy syndrome, not elsewhere classified: Secondary | ICD-10-CM | POA: Diagnosis not present

## 2017-09-21 DIAGNOSIS — G894 Chronic pain syndrome: Secondary | ICD-10-CM | POA: Diagnosis not present

## 2017-09-21 DIAGNOSIS — K5909 Other constipation: Secondary | ICD-10-CM | POA: Diagnosis not present

## 2017-09-21 DIAGNOSIS — M5416 Radiculopathy, lumbar region: Secondary | ICD-10-CM | POA: Diagnosis not present

## 2017-09-21 DIAGNOSIS — M533 Sacrococcygeal disorders, not elsewhere classified: Secondary | ICD-10-CM | POA: Diagnosis not present

## 2017-09-22 DIAGNOSIS — M4326 Fusion of spine, lumbar region: Secondary | ICD-10-CM | POA: Diagnosis not present

## 2017-09-22 DIAGNOSIS — G894 Chronic pain syndrome: Secondary | ICD-10-CM | POA: Diagnosis not present

## 2017-09-30 DIAGNOSIS — R7303 Prediabetes: Secondary | ICD-10-CM | POA: Diagnosis not present

## 2017-09-30 DIAGNOSIS — M545 Low back pain: Secondary | ICD-10-CM | POA: Diagnosis not present

## 2017-09-30 DIAGNOSIS — I10 Essential (primary) hypertension: Secondary | ICD-10-CM | POA: Diagnosis not present

## 2017-09-30 DIAGNOSIS — Z6829 Body mass index (BMI) 29.0-29.9, adult: Secondary | ICD-10-CM | POA: Diagnosis not present

## 2017-10-19 DIAGNOSIS — K5909 Other constipation: Secondary | ICD-10-CM | POA: Diagnosis not present

## 2017-10-19 DIAGNOSIS — M5416 Radiculopathy, lumbar region: Secondary | ICD-10-CM | POA: Diagnosis not present

## 2017-10-19 DIAGNOSIS — M461 Sacroiliitis, not elsewhere classified: Secondary | ICD-10-CM | POA: Diagnosis not present

## 2017-10-19 DIAGNOSIS — M533 Sacrococcygeal disorders, not elsewhere classified: Secondary | ICD-10-CM | POA: Diagnosis not present

## 2017-10-19 DIAGNOSIS — G894 Chronic pain syndrome: Secondary | ICD-10-CM | POA: Diagnosis not present

## 2017-10-19 DIAGNOSIS — M961 Postlaminectomy syndrome, not elsewhere classified: Secondary | ICD-10-CM | POA: Diagnosis not present

## 2017-11-23 DIAGNOSIS — M5416 Radiculopathy, lumbar region: Secondary | ICD-10-CM | POA: Diagnosis not present

## 2017-11-23 DIAGNOSIS — M461 Sacroiliitis, not elsewhere classified: Secondary | ICD-10-CM | POA: Diagnosis not present

## 2017-11-23 DIAGNOSIS — M533 Sacrococcygeal disorders, not elsewhere classified: Secondary | ICD-10-CM | POA: Diagnosis not present

## 2017-11-23 DIAGNOSIS — M961 Postlaminectomy syndrome, not elsewhere classified: Secondary | ICD-10-CM | POA: Diagnosis not present

## 2017-11-23 DIAGNOSIS — G894 Chronic pain syndrome: Secondary | ICD-10-CM | POA: Diagnosis not present

## 2017-11-23 DIAGNOSIS — K5909 Other constipation: Secondary | ICD-10-CM | POA: Diagnosis not present

## 2017-11-28 DIAGNOSIS — M79662 Pain in left lower leg: Secondary | ICD-10-CM | POA: Diagnosis not present

## 2017-11-28 DIAGNOSIS — I10 Essential (primary) hypertension: Secondary | ICD-10-CM | POA: Diagnosis not present

## 2017-11-28 DIAGNOSIS — Z6831 Body mass index (BMI) 31.0-31.9, adult: Secondary | ICD-10-CM | POA: Diagnosis not present

## 2017-11-29 DIAGNOSIS — M7989 Other specified soft tissue disorders: Secondary | ICD-10-CM | POA: Diagnosis not present

## 2017-11-29 DIAGNOSIS — M79662 Pain in left lower leg: Secondary | ICD-10-CM | POA: Diagnosis not present

## 2017-12-25 DIAGNOSIS — R109 Unspecified abdominal pain: Secondary | ICD-10-CM | POA: Diagnosis not present

## 2017-12-25 DIAGNOSIS — R079 Chest pain, unspecified: Secondary | ICD-10-CM | POA: Diagnosis not present

## 2017-12-25 DIAGNOSIS — I10 Essential (primary) hypertension: Secondary | ICD-10-CM | POA: Diagnosis not present

## 2017-12-25 DIAGNOSIS — R0789 Other chest pain: Secondary | ICD-10-CM | POA: Diagnosis not present

## 2017-12-25 DIAGNOSIS — R072 Precordial pain: Secondary | ICD-10-CM | POA: Diagnosis not present

## 2017-12-26 ENCOUNTER — Encounter: Payer: Self-pay | Admitting: Gastroenterology

## 2017-12-28 DIAGNOSIS — M533 Sacrococcygeal disorders, not elsewhere classified: Secondary | ICD-10-CM | POA: Diagnosis not present

## 2017-12-28 DIAGNOSIS — M961 Postlaminectomy syndrome, not elsewhere classified: Secondary | ICD-10-CM | POA: Diagnosis not present

## 2017-12-28 DIAGNOSIS — M461 Sacroiliitis, not elsewhere classified: Secondary | ICD-10-CM | POA: Diagnosis not present

## 2017-12-28 DIAGNOSIS — K5909 Other constipation: Secondary | ICD-10-CM | POA: Diagnosis not present

## 2017-12-28 DIAGNOSIS — M5416 Radiculopathy, lumbar region: Secondary | ICD-10-CM | POA: Diagnosis not present

## 2017-12-28 DIAGNOSIS — G894 Chronic pain syndrome: Secondary | ICD-10-CM | POA: Diagnosis not present

## 2017-12-30 DIAGNOSIS — R1013 Epigastric pain: Secondary | ICD-10-CM | POA: Diagnosis not present

## 2017-12-30 DIAGNOSIS — I10 Essential (primary) hypertension: Secondary | ICD-10-CM | POA: Diagnosis not present

## 2017-12-30 DIAGNOSIS — R7303 Prediabetes: Secondary | ICD-10-CM | POA: Diagnosis not present

## 2017-12-30 DIAGNOSIS — Z09 Encounter for follow-up examination after completed treatment for conditions other than malignant neoplasm: Secondary | ICD-10-CM | POA: Diagnosis not present

## 2018-01-19 ENCOUNTER — Encounter: Payer: Self-pay | Admitting: Gastroenterology

## 2018-01-20 ENCOUNTER — Ambulatory Visit (INDEPENDENT_AMBULATORY_CARE_PROVIDER_SITE_OTHER): Payer: Medicare Other | Admitting: Gastroenterology

## 2018-01-20 ENCOUNTER — Encounter: Payer: Self-pay | Admitting: Gastroenterology

## 2018-01-20 VITALS — BP 134/76 | HR 75 | Ht 73.0 in | Wt 234.0 lb

## 2018-01-20 DIAGNOSIS — E876 Hypokalemia: Secondary | ICD-10-CM | POA: Diagnosis not present

## 2018-01-20 DIAGNOSIS — R1013 Epigastric pain: Secondary | ICD-10-CM

## 2018-01-20 MED ORDER — PANTOPRAZOLE SODIUM 40 MG PO TBEC: DELAYED_RELEASE_TABLET | ORAL | 0 refills | Status: DC

## 2018-01-20 MED ORDER — POTASSIUM CHLORIDE CRYS ER 20 MEQ PO TBCR: 20.0000 meq | EXTENDED_RELEASE_TABLET | Freq: Every day | ORAL | 0 refills | Status: DC

## 2018-01-20 NOTE — Patient Instructions (Signed)
If you are age 48 or older, your body mass index should be between 23-30. Your Body mass index is 30.87 kg/m. If this is out of the aforementioned range listed, please consider follow up with your Primary Care Provider.  If you are age 48 or younger, your body mass index should be between 19-25. Your Body mass index is 30.87 kg/m. If this is out of the aformentioned range listed, please consider follow up with your Primary Care Provider.   We have sent the following medications to your pharmacy for you to pick up at your convenience: K Dur 20 meq once daily.  Protonix 40 mg twice daily x 2 weeks and then once daily.   Please call back next week to schedule your EGD.   Thank you,  Dr. Lynann Bolognaajesh Gupta

## 2018-01-20 NOTE — Progress Notes (Signed)
Chief Complaint: Abdominal pain  Referring Provider:  Dulce SellarStephanie Hudnell PA     ASSESSMENT AND PLAN;   #1. Epigastric pain - neg CTA 12/2017, s/p lap chole in past - Protonix 40mg  po bid x 2 weeks, then once a day. - EGD for further evaluation.  I discussed risks and benefits.  #2. Hypokalemia (due to HCTZ) - Kdur 20mg  po qd #30   HPI:    [name] is a 47 y.o. male  With epigastric pain [facility] - CTA negative for any acute abnormalities.  Labs were normal CBC, CMP except potassium 3.4.  Patient is taking hydrochlorothiazide.  He was given omeprazole 20 mg p.o. once a day with no relief.  Continues to have problems.  Wants to get EGD performed. No odynophagia or dysphagia No melena or hematochezia Denies having any abdominal bloating, weight loss or loss of appetite.   Past GI procedures: -EGD 07/08/2008 small hiatal hernia, otherwise mild gastritis with negative biopsies. -Colonoscopy 01/2011 internal hemorrhoids, mild pancolonic diverticulosis -CTA as above   Past Medical History:  Diagnosis Date  . Amenia   . DJD (degenerative joint disease)   . Dyslipidemia   . Fatty liver   . GERD (gastroesophageal reflux disease)   . HTN (hypertension)   . Hx of acute pancreatitis     Past Surgical History:  Procedure Laterality Date  . CHOLECYSTECTOMY  2008  . COLONOSCOPY  01/14/2011   Internal hemorrhoids. Mild pancolonic diverticulosis.   Marland Kitchen. ESOPHAGOGASTRODUODENOSCOPY  07/08/2008   Mild gastritis. Minimal hiatal hernia.   Marland Kitchen. KNEE SURGERY Left 1998  . Lower back surgery     2013 and 2018  . right knee surgery  2005    Family History  Problem Relation Age of Onset  . Stomach cancer Father   . Diabetes Father   . Kidney disease Father   . Colon cancer Neg Hx   . Esophageal cancer Neg Hx     Social History   Tobacco Use  . Smoking status: Never Smoker  . Smokeless tobacco: Never Used  Substance Use Topics  . Alcohol  use: Never    Frequency: Never  . Drug use: Never    Current Outpatient Medications  Medication Sig Dispense Refill  . amLODipine-benazepril (LOTREL) 5-10 MG capsule Take 1 tablet by mouth daily.  2  . hydrochlorothiazide (HYDRODIURIL) 12.5 MG tablet Take 1 tablet by mouth daily.  5  . omeprazole (PRILOSEC) 20 MG capsule Take 1 capsule by mouth daily.  0   No current facility-administered medications for this visit.     Not on File  Review of Systems:  Constitutional: Denies fever, chills, diaphoresis, appetite change and fatigue.  HEENT: Denies photophobia, eye pain, redness, hearing loss, ear pain, congestion, sore throat, rhinorrhea, sneezing, mouth sores, neck pain, neck stiffness and tinnitus.   Respiratory: Denies SOB, DOE, cough, chest tightness,  and wheezing.   Cardiovascular: Denies chest pain, palpitations and leg swelling.  Genitourinary: Denies dysuria, urgency, frequency, hematuria, flank pain and difficulty urinating.  Musculoskeletal: Denies myalgias, back pain, joint swelling, arthralgias and gait problem.  Skin: No rash.  Neurological: Denies dizziness, seizures, syncope, weakness, light-headedness, numbness and headaches.  Hematological: Denies adenopathy. Easy bruising, personal or family bleeding history  Psychiatric/Behavioral: No anxiety or depression     Physical Exam:    BP 134/76   Pulse 75   Ht 6\' 1"  (1.854 m)   Wt 234 lb (106.1 kg)  BMI 30.87 kg/m  Filed Weights   01/20/18 1526  Weight: 234 lb (106.1 kg)   Constitutional:  Well-developed, in no acute distress. Psychiatric: Normal mood and affect. Behavior is normal. HEENT: Pupils normal.  Conjunctivae are normal. No scleral icterus. Neck supple.  Cardiovascular: Normal rate, regular rhythm. No edema Pulmonary/chest: Effort normal and breath sounds normal. No wheezing, rales or rhonchi. Abdominal: Soft, nondistended.  Mild epigastric tenderness. Bowel sounds active throughout. There are no  masses palpable. No hepatomegaly. Rectal:  defered Neurological: Alert and oriented to person place and time. Skin: Skin is warm and dry. No rashes noted.  Data Reviewed: I have personally reviewed following labs and imaging studies  CBC: CBC Latest Ref Rng & Units 04/23/2010 06/23/2008 06/02/2008  WBC 4.0 - 10.5 K/uL 6.8 4.0 5.2  Hemoglobin 13.0 - 17.0 g/dL 30.8 65.7 84.6  Hematocrit 39.0 - 52.0 % 46.4 42.3 42.7  Platelets 150 - 400 K/uL 328 233 254    CMP: CMP Latest Ref Rng & Units 04/23/2010 06/23/2008 06/02/2008  Glucose 70 - 99 mg/dL 98 962(X) 528(U)  BUN 6 - 23 mg/dL 5(L) 7 9  Creatinine 0.4 - 1.5 mg/dL 1.32 4.40 1.02  Sodium 135 - 145 mEq/L 139 137 137  Potassium 3.5 - 5.1 mEq/L 3.9 3.5 3.7  Chloride 96 - 112 mEq/L 102 103 100  CO2 19 - 32 mEq/L 29 26 28   Calcium 8.4 - 10.5 mg/dL 9.5 9.1 8.6  Total Protein 6.0 - 8.3 g/dL 7.7 7.0 6.1  Total Bilirubin 0.3 - 1.2 mg/dL 0.8 1.2 0.6  Alkaline Phos 39 - 117 U/L 63 51 56  AST 0 - 37 U/L 32 41(H) 26  ALT 0 - 53 U/L 58(H) 58(H) 51  CTA report was reviewed with the patient in detail.   Edman Circle, MD 01/20/2018, 3:50 PM  Cc: Dulce Sellar PA

## 2018-01-26 ENCOUNTER — Encounter: Payer: Self-pay | Admitting: Gastroenterology

## 2018-01-31 DIAGNOSIS — M533 Sacrococcygeal disorders, not elsewhere classified: Secondary | ICD-10-CM | POA: Diagnosis not present

## 2018-01-31 DIAGNOSIS — M5416 Radiculopathy, lumbar region: Secondary | ICD-10-CM | POA: Diagnosis not present

## 2018-01-31 DIAGNOSIS — M961 Postlaminectomy syndrome, not elsewhere classified: Secondary | ICD-10-CM | POA: Diagnosis not present

## 2018-01-31 DIAGNOSIS — M47816 Spondylosis without myelopathy or radiculopathy, lumbar region: Secondary | ICD-10-CM | POA: Diagnosis not present

## 2018-01-31 DIAGNOSIS — G894 Chronic pain syndrome: Secondary | ICD-10-CM | POA: Diagnosis not present

## 2018-01-31 DIAGNOSIS — M461 Sacroiliitis, not elsewhere classified: Secondary | ICD-10-CM | POA: Diagnosis not present

## 2018-02-03 DIAGNOSIS — Z23 Encounter for immunization: Secondary | ICD-10-CM | POA: Diagnosis not present

## 2018-02-20 ENCOUNTER — Other Ambulatory Visit: Payer: Self-pay | Admitting: Gastroenterology

## 2018-02-24 ENCOUNTER — Ambulatory Visit (AMBULATORY_SURGERY_CENTER): Payer: Self-pay

## 2018-02-24 VITALS — Ht 73.0 in | Wt 241.1 lb

## 2018-02-24 DIAGNOSIS — R1013 Epigastric pain: Secondary | ICD-10-CM

## 2018-02-24 NOTE — Progress Notes (Signed)
No egg or soy allergy known to patient  No issues with past sedation with any surgeries  or procedures, no intubation problems  No diet pills per patient No home 02 use per patient  No blood thinners per patient  Pt denies issues with constipation  No A fib or A flutter  EMMI video sent to pt's e mail. Pt for EGD   

## 2018-03-10 ENCOUNTER — Encounter: Payer: Self-pay | Admitting: Gastroenterology

## 2018-03-10 ENCOUNTER — Ambulatory Visit (INDEPENDENT_AMBULATORY_CARE_PROVIDER_SITE_OTHER): Payer: Medicare Other | Admitting: Gastroenterology

## 2018-03-10 VITALS — BP 121/78 | HR 60 | Temp 97.5°F | Resp 11 | Ht 73.0 in | Wt 241.0 lb

## 2018-03-10 DIAGNOSIS — K2210 Ulcer of esophagus without bleeding: Secondary | ICD-10-CM

## 2018-03-10 DIAGNOSIS — R1013 Epigastric pain: Secondary | ICD-10-CM

## 2018-03-10 DIAGNOSIS — K2970 Gastritis, unspecified, without bleeding: Secondary | ICD-10-CM | POA: Diagnosis not present

## 2018-03-10 DIAGNOSIS — K253 Acute gastric ulcer without hemorrhage or perforation: Secondary | ICD-10-CM

## 2018-03-10 DIAGNOSIS — K259 Gastric ulcer, unspecified as acute or chronic, without hemorrhage or perforation: Secondary | ICD-10-CM | POA: Diagnosis not present

## 2018-03-10 DIAGNOSIS — K449 Diaphragmatic hernia without obstruction or gangrene: Secondary | ICD-10-CM | POA: Diagnosis not present

## 2018-03-10 MED ORDER — SODIUM CHLORIDE 0.9 % IV SOLN: 500.0000 mL | Freq: Once | INTRAVENOUS | Status: DC

## 2018-03-10 MED ORDER — RABEPRAZOLE SODIUM 20 MG PO TBEC: 20.0000 mg | DELAYED_RELEASE_TABLET | Freq: Every day | ORAL | 0 refills | Status: DC

## 2018-03-10 NOTE — Patient Instructions (Addendum)
Biopsies taken of esophagus and stomach (gastric ulcer). Handouts given on hiatal hernia. No ibuprofen, naproxen, or other NSAIDS. Change Protonix to Aciphex 20 mg once daily.   YOU HAD AN ENDOSCOPIC PROCEDURE TODAY AT THE [facility]:   Refer to the procedure report that was given to you for any specific questions about what was found during the examination.  If the procedure report does not answer your questions, please call your gastroenterologist to clarify.  If you requested that your care partner not be given the details of your procedure findings, then the procedure report has been included in a sealed envelope for you to review at your convenience later.  YOU SHOULD EXPECT: Some feelings of bloating in the abdomen. Passage of more gas than usual.  Walking can help get rid of the air that was put into your GI tract during the procedure and reduce the bloating. If you had a lower endoscopy (such as a colonoscopy or flexible sigmoidoscopy) you may notice spotting of blood in your stool or on the toilet paper. If you underwent a bowel prep for your procedure, you may not have a normal bowel movement for a few days.  Please Note:  You might notice some irritation and congestion in your nose or some drainage.  This is from the oxygen used during your procedure.  There is no need for concern and it should clear up in a day or so.  SYMPTOMS TO REPORT IMMEDIATELY:   Following upper endoscopy (EGD)  Vomiting of blood or coffee ground material  New chest pain or pain under the shoulder blades  Painful or persistently difficult swallowing  New shortness of breath  Fever of 100F or higher  Black, tarry-looking stools  For urgent or emergent issues, a gastroenterologist can be reached at any hour by calling [phone].   DIET:  We do recommend a small meal at first, but then you may proceed to your regular diet.  Drink plenty of fluids but you should avoid alcoholic beverages  for 24 hours.  ACTIVITY:  You should plan to take it easy for the rest of today and you should NOT DRIVE or use heavy machinery until tomorrow (because of the sedation medicines used during the test).    FOLLOW UP: Our staff will call the number listed on your records the next business day following your procedure to check on you and address any questions or concerns that you may have regarding the information given to you following your procedure. If we do not reach you, we will leave a message.  However, if you are feeling well and you are not experiencing any problems, there is no need to return our call.  We will assume that you have returned to your regular daily activities without incident.  If any biopsies were taken you will be contacted by phone or by letter within the next 1-3 weeks.  Please call us at [phone] if you have not heard about the biopsies in 3 weeks.    SIGNATURES/CONFIDENTIALITY: You and/or your care partner have signed paperwork which will be entered into your electronic medical record.  These signatures attest to the fact that that the information above on your After Visit Summary has been reviewed and is understood.  Full responsibility of the confidentiality of this discharge information lies with you and/or your care-partner.

## 2018-03-10 NOTE — Progress Notes (Signed)
Called to room to assist during endoscopic procedure.  Patient ID and intended procedure confirmed with present staff. Received instructions for my participation in the procedure from the performing physician.  

## 2018-03-10 NOTE — Op Note (Signed)
[facility] Patient Name: [name] Procedure Date: 03/10/2018 2:27 PM MRN: [id number] Endoscopist: Lynann Bologna , MD Age: 47 Referring MD:  Date of Birth: 04-23-1971 Gender: Male Account #: [id number] Procedure:                Upper GI endoscopy Indications:              Epigastric abdominal pain Medicines:                Monitored Anesthesia Care Procedure:                Pre-Anesthesia Assessment:                           - Prior to the procedure, a History and Physical                            was performed, and patient medications and                            allergies were reviewed. The patient's tolerance of                            previous anesthesia was also reviewed. The risks                            and benefits of the procedure and the sedation                            options and risks were discussed with the patient.                            All questions were answered, and informed consent                            was obtained. Prior Anticoagulants: The patient has                            taken no previous anticoagulant or antiplatelet                            agents. ASA Grade Assessment: III - A patient with                            severe systemic disease. After reviewing the risks                            and benefits, the patient was deemed in                            satisfactory condition to undergo the procedure.                           After obtaining informed consent, the endoscope was  passed under direct vision. Throughout the                            procedure, the patient's blood pressure, pulse, and                            oxygen saturations were monitored continuously. The                            Endoscope was introduced through the mouth, and                            advanced to the second part of duodenum. The upper                            GI endoscopy was accomplished  without difficulty.                            The patient tolerated the procedure well. Scope In: Scope Out: Findings:                 The examined esophagus was normal. Biopsies were                            obtained from the proximal and distal esophagus                            with cold forceps for histology of suspected                            eosinophilic esophagitis.                           The Z-line was regular and was found 38 cm from the                            incisors. Transient small hiatal hernia was noted.                           2 non-bleeding superficial gastric ulcers measuring                            8 mm each were noted in the antrum. There was mild                            surrounding gastritis and few surrounding gastric                            erosions. Biopsies were taken with a cold forceps                            for histology from the base and from the edges of  the ulcers. Estimated blood loss was minimal. 2                            biopsies were obtained from the antrum and sent for                            CLO testing.                           The duodenum was normal. Complications:            No immediate complications. Estimated Blood Loss:     Estimated blood loss: none. Impression:               -Gastric ulcers with surrounding erosive gastritis                            (biopsied)                           -Transient small hiatal hernia. Recommendation:           - Patient has a contact number available for                            emergencies. The signs and symptoms of potential                            delayed complications were discussed with the                            patient. Return to normal activities tomorrow.                            Written discharge instructions were provided to the                            patient.                           - Resume previous diet.                            - No ibuprofen, naproxen, or other non-steroidal                            anti-inflammatory drugs.                           - Change Protonix to AcipHex 20 mg p.o. once a day.                            Expect complete healing in 8 to 12 weeks.                           - Await pathology results.                           -  Follow CLOtest.                           - Return to GI clinic in 12 weeks. Lynann Bologna, MD 03/10/2018 2:46:05 PM This report has been signed electronically.

## 2018-03-10 NOTE — Progress Notes (Signed)
PT taken to PACU. Monitors in place. VSS. Report given to RN. 

## 2018-03-12 ENCOUNTER — Other Ambulatory Visit: Payer: Self-pay | Admitting: Gastroenterology

## 2018-03-13 ENCOUNTER — Telehealth: Payer: Self-pay | Admitting: *Deleted

## 2018-03-13 LAB — HELICOBACTER PYLORI SCREEN-BIOPSY: UREASE: NEGATIVE

## 2018-03-13 NOTE — Telephone Encounter (Signed)
Left message on f/u call 

## 2018-03-13 NOTE — Telephone Encounter (Signed)
Message left

## 2018-03-18 ENCOUNTER — Encounter: Payer: Self-pay | Admitting: Gastroenterology

## 2018-03-24 DIAGNOSIS — I10 Essential (primary) hypertension: Secondary | ICD-10-CM | POA: Diagnosis not present

## 2018-03-24 DIAGNOSIS — Z1331 Encounter for screening for depression: Secondary | ICD-10-CM | POA: Diagnosis not present

## 2018-03-24 DIAGNOSIS — R7303 Prediabetes: Secondary | ICD-10-CM | POA: Diagnosis not present

## 2018-03-24 DIAGNOSIS — M545 Low back pain: Secondary | ICD-10-CM | POA: Diagnosis not present

## 2018-04-10 DIAGNOSIS — R600 Localized edema: Secondary | ICD-10-CM | POA: Diagnosis not present

## 2018-04-10 DIAGNOSIS — Z6831 Body mass index (BMI) 31.0-31.9, adult: Secondary | ICD-10-CM | POA: Diagnosis not present

## 2018-04-15 ENCOUNTER — Other Ambulatory Visit: Payer: Self-pay | Admitting: Gastroenterology

## 2018-04-28 DIAGNOSIS — R1032 Left lower quadrant pain: Secondary | ICD-10-CM | POA: Diagnosis not present

## 2018-05-01 DIAGNOSIS — G894 Chronic pain syndrome: Secondary | ICD-10-CM | POA: Diagnosis not present

## 2018-05-01 DIAGNOSIS — M545 Low back pain: Secondary | ICD-10-CM | POA: Diagnosis not present

## 2018-05-01 DIAGNOSIS — M5416 Radiculopathy, lumbar region: Secondary | ICD-10-CM | POA: Diagnosis not present

## 2018-05-05 DIAGNOSIS — M545 Low back pain: Secondary | ICD-10-CM | POA: Diagnosis not present

## 2018-05-31 DIAGNOSIS — G894 Chronic pain syndrome: Secondary | ICD-10-CM | POA: Diagnosis not present

## 2018-05-31 DIAGNOSIS — M545 Low back pain: Secondary | ICD-10-CM | POA: Diagnosis not present

## 2018-05-31 DIAGNOSIS — M519 Unspecified thoracic, thoracolumbar and lumbosacral intervertebral disc disorder: Secondary | ICD-10-CM | POA: Diagnosis not present

## 2018-05-31 DIAGNOSIS — M4326 Fusion of spine, lumbar region: Secondary | ICD-10-CM | POA: Diagnosis not present

## 2018-06-23 DIAGNOSIS — M545 Low back pain: Secondary | ICD-10-CM | POA: Diagnosis not present

## 2018-06-23 DIAGNOSIS — M25561 Pain in right knee: Secondary | ICD-10-CM | POA: Diagnosis not present

## 2018-06-23 DIAGNOSIS — I10 Essential (primary) hypertension: Secondary | ICD-10-CM | POA: Diagnosis not present

## 2018-06-23 DIAGNOSIS — R7303 Prediabetes: Secondary | ICD-10-CM | POA: Diagnosis not present

## 2018-06-30 DIAGNOSIS — M1711 Unilateral primary osteoarthritis, right knee: Secondary | ICD-10-CM | POA: Diagnosis not present

## 2018-06-30 DIAGNOSIS — M25561 Pain in right knee: Secondary | ICD-10-CM | POA: Diagnosis not present

## 2018-07-03 DIAGNOSIS — M6281 Muscle weakness (generalized): Secondary | ICD-10-CM | POA: Diagnosis not present

## 2018-07-03 DIAGNOSIS — R2689 Other abnormalities of gait and mobility: Secondary | ICD-10-CM | POA: Diagnosis not present

## 2018-07-03 DIAGNOSIS — M25561 Pain in right knee: Secondary | ICD-10-CM | POA: Diagnosis not present

## 2018-07-05 DIAGNOSIS — M6281 Muscle weakness (generalized): Secondary | ICD-10-CM | POA: Diagnosis not present

## 2018-07-05 DIAGNOSIS — R2689 Other abnormalities of gait and mobility: Secondary | ICD-10-CM | POA: Diagnosis not present

## 2018-07-05 DIAGNOSIS — M25561 Pain in right knee: Secondary | ICD-10-CM | POA: Diagnosis not present

## 2018-07-10 DIAGNOSIS — R2689 Other abnormalities of gait and mobility: Secondary | ICD-10-CM | POA: Diagnosis not present

## 2018-07-10 DIAGNOSIS — M25561 Pain in right knee: Secondary | ICD-10-CM | POA: Diagnosis not present

## 2018-07-10 DIAGNOSIS — M6281 Muscle weakness (generalized): Secondary | ICD-10-CM | POA: Diagnosis not present

## 2018-07-12 DIAGNOSIS — R2689 Other abnormalities of gait and mobility: Secondary | ICD-10-CM | POA: Diagnosis not present

## 2018-07-12 DIAGNOSIS — M25561 Pain in right knee: Secondary | ICD-10-CM | POA: Diagnosis not present

## 2018-07-12 DIAGNOSIS — M6281 Muscle weakness (generalized): Secondary | ICD-10-CM | POA: Diagnosis not present

## 2018-07-17 DIAGNOSIS — R2689 Other abnormalities of gait and mobility: Secondary | ICD-10-CM | POA: Diagnosis not present

## 2018-07-17 DIAGNOSIS — M25561 Pain in right knee: Secondary | ICD-10-CM | POA: Diagnosis not present

## 2018-07-17 DIAGNOSIS — M6281 Muscle weakness (generalized): Secondary | ICD-10-CM | POA: Diagnosis not present

## 2018-07-19 DIAGNOSIS — M6281 Muscle weakness (generalized): Secondary | ICD-10-CM | POA: Diagnosis not present

## 2018-07-19 DIAGNOSIS — R2689 Other abnormalities of gait and mobility: Secondary | ICD-10-CM | POA: Diagnosis not present

## 2018-07-19 DIAGNOSIS — M25561 Pain in right knee: Secondary | ICD-10-CM | POA: Diagnosis not present

## 2018-07-24 DIAGNOSIS — R2689 Other abnormalities of gait and mobility: Secondary | ICD-10-CM | POA: Diagnosis not present

## 2018-07-24 DIAGNOSIS — M6281 Muscle weakness (generalized): Secondary | ICD-10-CM | POA: Diagnosis not present

## 2018-07-24 DIAGNOSIS — M25561 Pain in right knee: Secondary | ICD-10-CM | POA: Diagnosis not present

## 2018-07-26 DIAGNOSIS — M6281 Muscle weakness (generalized): Secondary | ICD-10-CM | POA: Diagnosis not present

## 2018-07-26 DIAGNOSIS — M25561 Pain in right knee: Secondary | ICD-10-CM | POA: Diagnosis not present

## 2018-07-26 DIAGNOSIS — R2689 Other abnormalities of gait and mobility: Secondary | ICD-10-CM | POA: Diagnosis not present

## 2018-07-31 DIAGNOSIS — R2689 Other abnormalities of gait and mobility: Secondary | ICD-10-CM | POA: Diagnosis not present

## 2018-07-31 DIAGNOSIS — M25561 Pain in right knee: Secondary | ICD-10-CM | POA: Diagnosis not present

## 2018-07-31 DIAGNOSIS — M1711 Unilateral primary osteoarthritis, right knee: Secondary | ICD-10-CM | POA: Diagnosis not present

## 2018-07-31 DIAGNOSIS — M6281 Muscle weakness (generalized): Secondary | ICD-10-CM | POA: Diagnosis not present

## 2018-08-02 DIAGNOSIS — M6281 Muscle weakness (generalized): Secondary | ICD-10-CM | POA: Diagnosis not present

## 2018-08-02 DIAGNOSIS — R2689 Other abnormalities of gait and mobility: Secondary | ICD-10-CM | POA: Diagnosis not present

## 2018-08-02 DIAGNOSIS — M25561 Pain in right knee: Secondary | ICD-10-CM | POA: Diagnosis not present

## 2018-08-16 DIAGNOSIS — M25562 Pain in left knee: Secondary | ICD-10-CM | POA: Diagnosis not present

## 2018-08-21 DIAGNOSIS — M25562 Pain in left knee: Secondary | ICD-10-CM | POA: Diagnosis not present

## 2018-08-21 DIAGNOSIS — M25561 Pain in right knee: Secondary | ICD-10-CM | POA: Diagnosis not present

## 2018-08-21 DIAGNOSIS — M6281 Muscle weakness (generalized): Secondary | ICD-10-CM | POA: Diagnosis not present

## 2018-08-21 DIAGNOSIS — R2689 Other abnormalities of gait and mobility: Secondary | ICD-10-CM | POA: Diagnosis not present

## 2018-08-22 ENCOUNTER — Encounter: Payer: Self-pay | Admitting: Gastroenterology

## 2018-08-28 DIAGNOSIS — M6281 Muscle weakness (generalized): Secondary | ICD-10-CM | POA: Diagnosis not present

## 2018-08-28 DIAGNOSIS — M25561 Pain in right knee: Secondary | ICD-10-CM | POA: Diagnosis not present

## 2018-08-28 DIAGNOSIS — R2689 Other abnormalities of gait and mobility: Secondary | ICD-10-CM | POA: Diagnosis not present

## 2018-08-28 DIAGNOSIS — M25562 Pain in left knee: Secondary | ICD-10-CM | POA: Diagnosis not present

## 2018-09-04 DIAGNOSIS — R2689 Other abnormalities of gait and mobility: Secondary | ICD-10-CM | POA: Diagnosis not present

## 2018-09-04 DIAGNOSIS — M25562 Pain in left knee: Secondary | ICD-10-CM | POA: Diagnosis not present

## 2018-09-04 DIAGNOSIS — M25561 Pain in right knee: Secondary | ICD-10-CM | POA: Diagnosis not present

## 2018-09-04 DIAGNOSIS — M6281 Muscle weakness (generalized): Secondary | ICD-10-CM | POA: Diagnosis not present

## 2018-09-11 DIAGNOSIS — M25562 Pain in left knee: Secondary | ICD-10-CM | POA: Diagnosis not present

## 2018-09-11 DIAGNOSIS — M6281 Muscle weakness (generalized): Secondary | ICD-10-CM | POA: Diagnosis not present

## 2018-09-11 DIAGNOSIS — R2689 Other abnormalities of gait and mobility: Secondary | ICD-10-CM | POA: Diagnosis not present

## 2018-09-11 DIAGNOSIS — M25561 Pain in right knee: Secondary | ICD-10-CM | POA: Diagnosis not present

## 2018-09-13 DIAGNOSIS — M1712 Unilateral primary osteoarthritis, left knee: Secondary | ICD-10-CM | POA: Diagnosis not present

## 2018-09-18 DIAGNOSIS — M25562 Pain in left knee: Secondary | ICD-10-CM | POA: Diagnosis not present

## 2018-09-18 DIAGNOSIS — M25561 Pain in right knee: Secondary | ICD-10-CM | POA: Diagnosis not present

## 2018-09-18 DIAGNOSIS — R2689 Other abnormalities of gait and mobility: Secondary | ICD-10-CM | POA: Diagnosis not present

## 2018-09-18 DIAGNOSIS — M6281 Muscle weakness (generalized): Secondary | ICD-10-CM | POA: Diagnosis not present

## 2018-09-19 DIAGNOSIS — M1712 Unilateral primary osteoarthritis, left knee: Secondary | ICD-10-CM | POA: Diagnosis not present

## 2018-09-19 DIAGNOSIS — M25562 Pain in left knee: Secondary | ICD-10-CM | POA: Diagnosis not present

## 2018-09-22 DIAGNOSIS — Z0000 Encounter for general adult medical examination without abnormal findings: Secondary | ICD-10-CM | POA: Diagnosis not present

## 2018-09-22 DIAGNOSIS — M545 Low back pain: Secondary | ICD-10-CM | POA: Diagnosis not present

## 2018-09-22 DIAGNOSIS — M25561 Pain in right knee: Secondary | ICD-10-CM | POA: Diagnosis not present

## 2018-09-22 DIAGNOSIS — R7303 Prediabetes: Secondary | ICD-10-CM | POA: Diagnosis not present

## 2018-09-22 DIAGNOSIS — M1712 Unilateral primary osteoarthritis, left knee: Secondary | ICD-10-CM | POA: Diagnosis not present

## 2018-09-22 DIAGNOSIS — M25562 Pain in left knee: Secondary | ICD-10-CM | POA: Diagnosis not present

## 2018-09-22 DIAGNOSIS — Z1331 Encounter for screening for depression: Secondary | ICD-10-CM | POA: Diagnosis not present

## 2018-09-22 DIAGNOSIS — Z9181 History of falling: Secondary | ICD-10-CM | POA: Diagnosis not present

## 2018-09-22 DIAGNOSIS — Z6831 Body mass index (BMI) 31.0-31.9, adult: Secondary | ICD-10-CM | POA: Diagnosis not present

## 2018-09-22 DIAGNOSIS — I10 Essential (primary) hypertension: Secondary | ICD-10-CM | POA: Diagnosis not present

## 2018-11-15 DIAGNOSIS — Z20828 Contact with and (suspected) exposure to other viral communicable diseases: Secondary | ICD-10-CM | POA: Diagnosis not present

## 2019-01-30 DIAGNOSIS — R197 Diarrhea, unspecified: Secondary | ICD-10-CM | POA: Diagnosis not present

## 2019-01-30 DIAGNOSIS — R1032 Left lower quadrant pain: Secondary | ICD-10-CM | POA: Diagnosis not present

## 2019-01-30 DIAGNOSIS — R1110 Vomiting, unspecified: Secondary | ICD-10-CM | POA: Diagnosis not present

## 2019-02-05 DIAGNOSIS — R1032 Left lower quadrant pain: Secondary | ICD-10-CM | POA: Diagnosis not present

## 2019-02-05 DIAGNOSIS — Z23 Encounter for immunization: Secondary | ICD-10-CM | POA: Diagnosis not present

## 2019-02-05 DIAGNOSIS — Z6831 Body mass index (BMI) 31.0-31.9, adult: Secondary | ICD-10-CM | POA: Diagnosis not present

## 2019-05-12 DIAGNOSIS — R52 Pain, unspecified: Secondary | ICD-10-CM | POA: Diagnosis not present

## 2019-05-12 DIAGNOSIS — R609 Edema, unspecified: Secondary | ICD-10-CM | POA: Diagnosis not present

## 2019-08-21 DIAGNOSIS — I10 Essential (primary) hypertension: Secondary | ICD-10-CM | POA: Diagnosis not present

## 2019-08-21 DIAGNOSIS — E785 Hyperlipidemia, unspecified: Secondary | ICD-10-CM | POA: Diagnosis not present

## 2019-08-21 DIAGNOSIS — M545 Low back pain: Secondary | ICD-10-CM | POA: Diagnosis not present

## 2019-08-21 DIAGNOSIS — R7303 Prediabetes: Secondary | ICD-10-CM | POA: Diagnosis not present

## 2019-08-27 DIAGNOSIS — R739 Hyperglycemia, unspecified: Secondary | ICD-10-CM | POA: Diagnosis not present

## 2019-09-18 DIAGNOSIS — G629 Polyneuropathy, unspecified: Secondary | ICD-10-CM | POA: Diagnosis not present

## 2019-09-21 DIAGNOSIS — G894 Chronic pain syndrome: Secondary | ICD-10-CM | POA: Diagnosis not present

## 2019-09-21 DIAGNOSIS — M5416 Radiculopathy, lumbar region: Secondary | ICD-10-CM | POA: Diagnosis not present

## 2019-09-21 DIAGNOSIS — M4326 Fusion of spine, lumbar region: Secondary | ICD-10-CM | POA: Diagnosis not present

## 2019-09-21 DIAGNOSIS — M545 Low back pain: Secondary | ICD-10-CM | POA: Diagnosis not present

## 2019-09-26 DIAGNOSIS — M4326 Fusion of spine, lumbar region: Secondary | ICD-10-CM | POA: Diagnosis not present

## 2019-09-26 DIAGNOSIS — G894 Chronic pain syndrome: Secondary | ICD-10-CM | POA: Diagnosis not present

## 2019-09-26 DIAGNOSIS — M5416 Radiculopathy, lumbar region: Secondary | ICD-10-CM | POA: Diagnosis not present

## 2019-09-26 DIAGNOSIS — M545 Low back pain: Secondary | ICD-10-CM | POA: Diagnosis not present

## 2019-09-28 DIAGNOSIS — K4090 Unilateral inguinal hernia, without obstruction or gangrene, not specified as recurrent: Secondary | ICD-10-CM | POA: Diagnosis not present

## 2019-09-28 DIAGNOSIS — R8290 Unspecified abnormal findings in urine: Secondary | ICD-10-CM | POA: Diagnosis not present

## 2019-09-28 DIAGNOSIS — N433 Hydrocele, unspecified: Secondary | ICD-10-CM | POA: Diagnosis not present

## 2019-09-28 DIAGNOSIS — R82998 Other abnormal findings in urine: Secondary | ICD-10-CM | POA: Diagnosis not present

## 2019-09-28 DIAGNOSIS — K4020 Bilateral inguinal hernia, without obstruction or gangrene, not specified as recurrent: Secondary | ICD-10-CM | POA: Diagnosis not present

## 2019-09-28 DIAGNOSIS — R1032 Left lower quadrant pain: Secondary | ICD-10-CM | POA: Diagnosis not present

## 2019-09-28 DIAGNOSIS — N5089 Other specified disorders of the male genital organs: Secondary | ICD-10-CM | POA: Diagnosis not present

## 2019-10-04 DIAGNOSIS — N433 Hydrocele, unspecified: Secondary | ICD-10-CM | POA: Diagnosis not present

## 2019-10-10 DIAGNOSIS — N433 Hydrocele, unspecified: Secondary | ICD-10-CM | POA: Diagnosis not present

## 2019-10-10 DIAGNOSIS — Z79899 Other long term (current) drug therapy: Secondary | ICD-10-CM | POA: Diagnosis not present

## 2019-10-10 DIAGNOSIS — K429 Umbilical hernia without obstruction or gangrene: Secondary | ICD-10-CM | POA: Diagnosis not present

## 2019-10-10 DIAGNOSIS — N451 Epididymitis: Secondary | ICD-10-CM | POA: Diagnosis not present

## 2019-10-10 DIAGNOSIS — R1031 Right lower quadrant pain: Secondary | ICD-10-CM | POA: Diagnosis not present

## 2019-10-10 DIAGNOSIS — N50811 Right testicular pain: Secondary | ICD-10-CM | POA: Diagnosis not present

## 2019-10-10 DIAGNOSIS — K4090 Unilateral inguinal hernia, without obstruction or gangrene, not specified as recurrent: Secondary | ICD-10-CM | POA: Diagnosis not present

## 2019-11-12 DIAGNOSIS — K4090 Unilateral inguinal hernia, without obstruction or gangrene, not specified as recurrent: Secondary | ICD-10-CM | POA: Diagnosis not present

## 2019-11-12 DIAGNOSIS — I10 Essential (primary) hypertension: Secondary | ICD-10-CM | POA: Diagnosis not present

## 2019-11-12 DIAGNOSIS — E669 Obesity, unspecified: Secondary | ICD-10-CM | POA: Diagnosis not present

## 2019-11-12 DIAGNOSIS — K429 Umbilical hernia without obstruction or gangrene: Secondary | ICD-10-CM | POA: Diagnosis not present

## 2019-11-12 DIAGNOSIS — G8929 Other chronic pain: Secondary | ICD-10-CM | POA: Diagnosis not present

## 2019-11-12 DIAGNOSIS — Z862 Personal history of diseases of the blood and blood-forming organs and certain disorders involving the immune mechanism: Secondary | ICD-10-CM | POA: Diagnosis not present

## 2019-11-12 DIAGNOSIS — M1990 Unspecified osteoarthritis, unspecified site: Secondary | ICD-10-CM | POA: Diagnosis not present

## 2019-11-12 DIAGNOSIS — I4510 Unspecified right bundle-branch block: Secondary | ICD-10-CM | POA: Diagnosis not present

## 2019-11-12 DIAGNOSIS — Z79899 Other long term (current) drug therapy: Secondary | ICD-10-CM | POA: Diagnosis not present

## 2019-11-12 DIAGNOSIS — K219 Gastro-esophageal reflux disease without esophagitis: Secondary | ICD-10-CM | POA: Diagnosis not present

## 2019-11-12 DIAGNOSIS — Z6830 Body mass index (BMI) 30.0-30.9, adult: Secondary | ICD-10-CM | POA: Diagnosis not present

## 2019-11-12 DIAGNOSIS — M545 Low back pain: Secondary | ICD-10-CM | POA: Diagnosis not present

## 2019-11-12 DIAGNOSIS — R7303 Prediabetes: Secondary | ICD-10-CM | POA: Diagnosis not present

## 2019-11-12 HISTORY — PX: HERNIA REPAIR: SHX51

## 2019-11-20 DIAGNOSIS — Z09 Encounter for follow-up examination after completed treatment for conditions other than malignant neoplasm: Secondary | ICD-10-CM | POA: Diagnosis not present

## 2019-11-27 DIAGNOSIS — N50811 Right testicular pain: Secondary | ICD-10-CM | POA: Diagnosis not present

## 2019-11-27 DIAGNOSIS — K4090 Unilateral inguinal hernia, without obstruction or gangrene, not specified as recurrent: Secondary | ICD-10-CM | POA: Diagnosis not present

## 2019-11-27 DIAGNOSIS — N451 Epididymitis: Secondary | ICD-10-CM | POA: Diagnosis not present

## 2019-11-27 DIAGNOSIS — N433 Hydrocele, unspecified: Secondary | ICD-10-CM | POA: Diagnosis not present

## 2019-11-27 DIAGNOSIS — R1031 Right lower quadrant pain: Secondary | ICD-10-CM | POA: Diagnosis not present

## 2019-11-27 DIAGNOSIS — K429 Umbilical hernia without obstruction or gangrene: Secondary | ICD-10-CM | POA: Diagnosis not present

## 2019-12-20 DIAGNOSIS — Z6831 Body mass index (BMI) 31.0-31.9, adult: Secondary | ICD-10-CM | POA: Diagnosis not present

## 2019-12-20 DIAGNOSIS — I10 Essential (primary) hypertension: Secondary | ICD-10-CM | POA: Diagnosis not present

## 2019-12-20 DIAGNOSIS — G8929 Other chronic pain: Secondary | ICD-10-CM | POA: Diagnosis not present

## 2019-12-20 DIAGNOSIS — M545 Low back pain: Secondary | ICD-10-CM | POA: Diagnosis not present

## 2019-12-20 DIAGNOSIS — E785 Hyperlipidemia, unspecified: Secondary | ICD-10-CM | POA: Diagnosis not present

## 2019-12-20 DIAGNOSIS — R7303 Prediabetes: Secondary | ICD-10-CM | POA: Diagnosis not present

## 2020-01-13 DIAGNOSIS — Z23 Encounter for immunization: Secondary | ICD-10-CM | POA: Diagnosis not present

## 2020-04-21 DIAGNOSIS — M545 Low back pain, unspecified: Secondary | ICD-10-CM | POA: Diagnosis not present

## 2020-04-21 DIAGNOSIS — I10 Essential (primary) hypertension: Secondary | ICD-10-CM | POA: Diagnosis not present

## 2020-04-21 DIAGNOSIS — Z1331 Encounter for screening for depression: Secondary | ICD-10-CM | POA: Diagnosis not present

## 2020-04-21 DIAGNOSIS — Z1211 Encounter for screening for malignant neoplasm of colon: Secondary | ICD-10-CM | POA: Diagnosis not present

## 2020-04-21 DIAGNOSIS — Z6830 Body mass index (BMI) 30.0-30.9, adult: Secondary | ICD-10-CM | POA: Diagnosis not present

## 2020-04-21 DIAGNOSIS — R7303 Prediabetes: Secondary | ICD-10-CM | POA: Diagnosis not present

## 2020-04-21 DIAGNOSIS — E785 Hyperlipidemia, unspecified: Secondary | ICD-10-CM | POA: Diagnosis not present

## 2020-04-21 DIAGNOSIS — G8929 Other chronic pain: Secondary | ICD-10-CM | POA: Diagnosis not present

## 2020-05-16 DIAGNOSIS — Z0000 Encounter for general adult medical examination without abnormal findings: Secondary | ICD-10-CM | POA: Diagnosis not present

## 2020-05-16 DIAGNOSIS — E785 Hyperlipidemia, unspecified: Secondary | ICD-10-CM | POA: Diagnosis not present

## 2020-05-16 DIAGNOSIS — Z9181 History of falling: Secondary | ICD-10-CM | POA: Diagnosis not present

## 2020-05-16 DIAGNOSIS — E669 Obesity, unspecified: Secondary | ICD-10-CM | POA: Diagnosis not present

## 2020-05-16 DIAGNOSIS — Z1331 Encounter for screening for depression: Secondary | ICD-10-CM | POA: Diagnosis not present

## 2020-07-23 DIAGNOSIS — M47816 Spondylosis without myelopathy or radiculopathy, lumbar region: Secondary | ICD-10-CM | POA: Diagnosis not present

## 2020-07-23 DIAGNOSIS — Z79899 Other long term (current) drug therapy: Secondary | ICD-10-CM | POA: Diagnosis not present

## 2020-07-23 DIAGNOSIS — Z79891 Long term (current) use of opiate analgesic: Secondary | ICD-10-CM | POA: Diagnosis not present

## 2020-07-23 DIAGNOSIS — M4326 Fusion of spine, lumbar region: Secondary | ICD-10-CM | POA: Diagnosis not present

## 2020-07-23 DIAGNOSIS — G894 Chronic pain syndrome: Secondary | ICD-10-CM | POA: Diagnosis not present

## 2020-07-24 DIAGNOSIS — M47816 Spondylosis without myelopathy or radiculopathy, lumbar region: Secondary | ICD-10-CM | POA: Diagnosis not present

## 2020-07-24 DIAGNOSIS — I10 Essential (primary) hypertension: Secondary | ICD-10-CM | POA: Diagnosis not present

## 2020-07-24 DIAGNOSIS — M4326 Fusion of spine, lumbar region: Secondary | ICD-10-CM | POA: Diagnosis not present

## 2020-07-24 DIAGNOSIS — Z6831 Body mass index (BMI) 31.0-31.9, adult: Secondary | ICD-10-CM | POA: Diagnosis not present

## 2020-08-04 DIAGNOSIS — M47816 Spondylosis without myelopathy or radiculopathy, lumbar region: Secondary | ICD-10-CM | POA: Diagnosis not present

## 2020-08-19 DIAGNOSIS — M545 Low back pain, unspecified: Secondary | ICD-10-CM | POA: Diagnosis not present

## 2020-08-19 DIAGNOSIS — M47816 Spondylosis without myelopathy or radiculopathy, lumbar region: Secondary | ICD-10-CM | POA: Diagnosis not present

## 2020-08-20 DIAGNOSIS — Z6830 Body mass index (BMI) 30.0-30.9, adult: Secondary | ICD-10-CM | POA: Diagnosis not present

## 2020-08-20 DIAGNOSIS — M545 Low back pain, unspecified: Secondary | ICD-10-CM | POA: Diagnosis not present

## 2020-08-20 DIAGNOSIS — G8929 Other chronic pain: Secondary | ICD-10-CM | POA: Diagnosis not present

## 2020-08-20 DIAGNOSIS — M25561 Pain in right knee: Secondary | ICD-10-CM | POA: Diagnosis not present

## 2020-08-20 DIAGNOSIS — E785 Hyperlipidemia, unspecified: Secondary | ICD-10-CM | POA: Diagnosis not present

## 2020-08-20 DIAGNOSIS — R7303 Prediabetes: Secondary | ICD-10-CM | POA: Diagnosis not present

## 2020-08-20 DIAGNOSIS — I10 Essential (primary) hypertension: Secondary | ICD-10-CM | POA: Diagnosis not present

## 2020-08-27 DIAGNOSIS — M4326 Fusion of spine, lumbar region: Secondary | ICD-10-CM | POA: Diagnosis not present

## 2020-08-27 DIAGNOSIS — M546 Pain in thoracic spine: Secondary | ICD-10-CM | POA: Diagnosis not present

## 2020-08-27 DIAGNOSIS — M47816 Spondylosis without myelopathy or radiculopathy, lumbar region: Secondary | ICD-10-CM | POA: Diagnosis not present

## 2020-09-03 DIAGNOSIS — M546 Pain in thoracic spine: Secondary | ICD-10-CM | POA: Diagnosis not present

## 2020-09-03 DIAGNOSIS — M5124 Other intervertebral disc displacement, thoracic region: Secondary | ICD-10-CM | POA: Diagnosis not present

## 2020-09-05 DIAGNOSIS — Z1211 Encounter for screening for malignant neoplasm of colon: Secondary | ICD-10-CM | POA: Diagnosis not present

## 2020-09-08 DIAGNOSIS — M47816 Spondylosis without myelopathy or radiculopathy, lumbar region: Secondary | ICD-10-CM | POA: Diagnosis not present

## 2020-09-08 DIAGNOSIS — M4326 Fusion of spine, lumbar region: Secondary | ICD-10-CM | POA: Diagnosis not present

## 2020-09-11 DIAGNOSIS — F0634 Mood disorder due to known physiological condition with mixed features: Secondary | ICD-10-CM | POA: Diagnosis not present

## 2020-09-12 DIAGNOSIS — F0634 Mood disorder due to known physiological condition with mixed features: Secondary | ICD-10-CM | POA: Diagnosis not present

## 2020-09-13 DIAGNOSIS — F0634 Mood disorder due to known physiological condition with mixed features: Secondary | ICD-10-CM | POA: Diagnosis not present

## 2020-09-14 DIAGNOSIS — F0634 Mood disorder due to known physiological condition with mixed features: Secondary | ICD-10-CM | POA: Diagnosis not present

## 2020-09-15 DIAGNOSIS — F0634 Mood disorder due to known physiological condition with mixed features: Secondary | ICD-10-CM | POA: Diagnosis not present

## 2020-09-23 DIAGNOSIS — G894 Chronic pain syndrome: Secondary | ICD-10-CM | POA: Diagnosis not present

## 2020-10-09 DIAGNOSIS — E119 Type 2 diabetes mellitus without complications: Secondary | ICD-10-CM | POA: Diagnosis not present

## 2020-10-09 DIAGNOSIS — M545 Low back pain, unspecified: Secondary | ICD-10-CM | POA: Diagnosis not present

## 2020-10-09 DIAGNOSIS — R6889 Other general symptoms and signs: Secondary | ICD-10-CM | POA: Diagnosis not present

## 2020-10-09 DIAGNOSIS — G8929 Other chronic pain: Secondary | ICD-10-CM | POA: Diagnosis not present

## 2020-10-09 DIAGNOSIS — Z79899 Other long term (current) drug therapy: Secondary | ICD-10-CM | POA: Diagnosis not present

## 2020-10-09 DIAGNOSIS — Z01818 Encounter for other preprocedural examination: Secondary | ICD-10-CM | POA: Diagnosis not present

## 2020-10-13 DIAGNOSIS — R0602 Shortness of breath: Secondary | ICD-10-CM | POA: Diagnosis not present

## 2020-10-13 DIAGNOSIS — R6889 Other general symptoms and signs: Secondary | ICD-10-CM | POA: Diagnosis not present

## 2020-10-13 DIAGNOSIS — Z01810 Encounter for preprocedural cardiovascular examination: Secondary | ICD-10-CM | POA: Diagnosis not present

## 2020-10-13 DIAGNOSIS — Z01818 Encounter for other preprocedural examination: Secondary | ICD-10-CM | POA: Diagnosis not present

## 2020-11-14 DIAGNOSIS — R748 Abnormal levels of other serum enzymes: Secondary | ICD-10-CM | POA: Diagnosis not present

## 2020-12-09 DIAGNOSIS — Z01810 Encounter for preprocedural cardiovascular examination: Secondary | ICD-10-CM | POA: Diagnosis not present

## 2020-12-09 DIAGNOSIS — G894 Chronic pain syndrome: Secondary | ICD-10-CM | POA: Diagnosis not present

## 2020-12-09 DIAGNOSIS — I4510 Unspecified right bundle-branch block: Secondary | ICD-10-CM | POA: Diagnosis not present

## 2020-12-09 DIAGNOSIS — Z01818 Encounter for other preprocedural examination: Secondary | ICD-10-CM | POA: Diagnosis not present

## 2020-12-09 DIAGNOSIS — R791 Abnormal coagulation profile: Secondary | ICD-10-CM | POA: Diagnosis not present

## 2020-12-09 DIAGNOSIS — Z01812 Encounter for preprocedural laboratory examination: Secondary | ICD-10-CM | POA: Diagnosis not present

## 2020-12-09 DIAGNOSIS — M47814 Spondylosis without myelopathy or radiculopathy, thoracic region: Secondary | ICD-10-CM | POA: Diagnosis not present

## 2020-12-09 DIAGNOSIS — M4326 Fusion of spine, lumbar region: Secondary | ICD-10-CM | POA: Diagnosis not present

## 2020-12-09 DIAGNOSIS — M47816 Spondylosis without myelopathy or radiculopathy, lumbar region: Secondary | ICD-10-CM | POA: Diagnosis not present

## 2020-12-15 DIAGNOSIS — M4326 Fusion of spine, lumbar region: Secondary | ICD-10-CM | POA: Diagnosis not present

## 2020-12-15 DIAGNOSIS — G894 Chronic pain syndrome: Secondary | ICD-10-CM | POA: Diagnosis not present

## 2020-12-15 DIAGNOSIS — Z9889 Other specified postprocedural states: Secondary | ICD-10-CM | POA: Diagnosis not present

## 2020-12-15 DIAGNOSIS — M5416 Radiculopathy, lumbar region: Secondary | ICD-10-CM | POA: Diagnosis not present

## 2020-12-15 DIAGNOSIS — M47816 Spondylosis without myelopathy or radiculopathy, lumbar region: Secondary | ICD-10-CM | POA: Diagnosis not present

## 2020-12-15 DIAGNOSIS — Z79899 Other long term (current) drug therapy: Secondary | ICD-10-CM | POA: Diagnosis not present

## 2020-12-15 DIAGNOSIS — Z981 Arthrodesis status: Secondary | ICD-10-CM | POA: Diagnosis not present

## 2020-12-15 DIAGNOSIS — M961 Postlaminectomy syndrome, not elsewhere classified: Secondary | ICD-10-CM | POA: Diagnosis not present

## 2020-12-16 DIAGNOSIS — Z79899 Other long term (current) drug therapy: Secondary | ICD-10-CM | POA: Diagnosis not present

## 2020-12-16 DIAGNOSIS — Z9889 Other specified postprocedural states: Secondary | ICD-10-CM | POA: Diagnosis not present

## 2020-12-16 DIAGNOSIS — G894 Chronic pain syndrome: Secondary | ICD-10-CM | POA: Diagnosis not present

## 2020-12-16 DIAGNOSIS — Z981 Arthrodesis status: Secondary | ICD-10-CM | POA: Diagnosis not present

## 2020-12-16 DIAGNOSIS — M47816 Spondylosis without myelopathy or radiculopathy, lumbar region: Secondary | ICD-10-CM | POA: Diagnosis not present

## 2020-12-16 DIAGNOSIS — M961 Postlaminectomy syndrome, not elsewhere classified: Secondary | ICD-10-CM | POA: Diagnosis not present

## 2021-01-23 DIAGNOSIS — G894 Chronic pain syndrome: Secondary | ICD-10-CM | POA: Diagnosis not present

## 2021-02-09 DIAGNOSIS — Z23 Encounter for immunization: Secondary | ICD-10-CM | POA: Diagnosis not present

## 2021-02-20 DIAGNOSIS — Z79899 Other long term (current) drug therapy: Secondary | ICD-10-CM | POA: Diagnosis not present

## 2021-02-20 DIAGNOSIS — Z79891 Long term (current) use of opiate analgesic: Secondary | ICD-10-CM | POA: Diagnosis not present

## 2021-02-20 DIAGNOSIS — G894 Chronic pain syndrome: Secondary | ICD-10-CM | POA: Diagnosis not present

## 2021-04-08 DIAGNOSIS — G8929 Other chronic pain: Secondary | ICD-10-CM | POA: Diagnosis not present

## 2021-04-08 DIAGNOSIS — Z6830 Body mass index (BMI) 30.0-30.9, adult: Secondary | ICD-10-CM | POA: Diagnosis not present

## 2021-04-08 DIAGNOSIS — I10 Essential (primary) hypertension: Secondary | ICD-10-CM | POA: Diagnosis not present

## 2021-04-08 DIAGNOSIS — E785 Hyperlipidemia, unspecified: Secondary | ICD-10-CM | POA: Diagnosis not present

## 2021-04-08 DIAGNOSIS — M545 Low back pain, unspecified: Secondary | ICD-10-CM | POA: Diagnosis not present

## 2021-04-08 DIAGNOSIS — R7303 Prediabetes: Secondary | ICD-10-CM | POA: Diagnosis not present

## 2021-04-08 DIAGNOSIS — M25561 Pain in right knee: Secondary | ICD-10-CM | POA: Diagnosis not present

## 2021-05-20 DIAGNOSIS — Z6830 Body mass index (BMI) 30.0-30.9, adult: Secondary | ICD-10-CM | POA: Diagnosis not present

## 2021-05-20 DIAGNOSIS — Z0000 Encounter for general adult medical examination without abnormal findings: Secondary | ICD-10-CM | POA: Diagnosis not present

## 2021-06-24 DIAGNOSIS — R109 Unspecified abdominal pain: Secondary | ICD-10-CM | POA: Diagnosis not present

## 2021-06-24 DIAGNOSIS — R748 Abnormal levels of other serum enzymes: Secondary | ICD-10-CM | POA: Diagnosis not present

## 2021-06-24 DIAGNOSIS — R1013 Epigastric pain: Secondary | ICD-10-CM | POA: Diagnosis not present

## 2021-06-24 DIAGNOSIS — R197 Diarrhea, unspecified: Secondary | ICD-10-CM | POA: Diagnosis not present

## 2021-06-24 DIAGNOSIS — I700 Atherosclerosis of aorta: Secondary | ICD-10-CM | POA: Diagnosis not present

## 2021-06-24 DIAGNOSIS — R112 Nausea with vomiting, unspecified: Secondary | ICD-10-CM | POA: Diagnosis not present

## 2021-06-25 DIAGNOSIS — R112 Nausea with vomiting, unspecified: Secondary | ICD-10-CM | POA: Diagnosis not present

## 2021-06-25 DIAGNOSIS — A059 Bacterial foodborne intoxication, unspecified: Secondary | ICD-10-CM | POA: Diagnosis not present

## 2021-06-25 DIAGNOSIS — R748 Abnormal levels of other serum enzymes: Secondary | ICD-10-CM | POA: Diagnosis not present

## 2021-06-26 DIAGNOSIS — R748 Abnormal levels of other serum enzymes: Secondary | ICD-10-CM | POA: Diagnosis not present

## 2021-06-26 DIAGNOSIS — I10 Essential (primary) hypertension: Secondary | ICD-10-CM | POA: Diagnosis not present

## 2021-06-26 DIAGNOSIS — R112 Nausea with vomiting, unspecified: Secondary | ICD-10-CM | POA: Diagnosis not present

## 2021-06-26 DIAGNOSIS — Z6830 Body mass index (BMI) 30.0-30.9, adult: Secondary | ICD-10-CM | POA: Diagnosis not present

## 2021-06-26 DIAGNOSIS — Z79899 Other long term (current) drug therapy: Secondary | ICD-10-CM | POA: Diagnosis not present

## 2021-06-26 DIAGNOSIS — M1990 Unspecified osteoarthritis, unspecified site: Secondary | ICD-10-CM | POA: Diagnosis not present

## 2021-06-26 DIAGNOSIS — E669 Obesity, unspecified: Secondary | ICD-10-CM | POA: Diagnosis not present

## 2021-06-26 DIAGNOSIS — D649 Anemia, unspecified: Secondary | ICD-10-CM | POA: Diagnosis not present

## 2021-06-26 DIAGNOSIS — R197 Diarrhea, unspecified: Secondary | ICD-10-CM | POA: Diagnosis present

## 2021-06-26 DIAGNOSIS — A059 Bacterial foodborne intoxication, unspecified: Secondary | ICD-10-CM | POA: Diagnosis not present

## 2021-06-26 DIAGNOSIS — E785 Hyperlipidemia, unspecified: Secondary | ICD-10-CM | POA: Diagnosis not present

## 2021-07-03 DIAGNOSIS — M545 Low back pain, unspecified: Secondary | ICD-10-CM | POA: Diagnosis not present

## 2021-07-03 DIAGNOSIS — G894 Chronic pain syndrome: Secondary | ICD-10-CM | POA: Diagnosis not present

## 2021-07-03 DIAGNOSIS — M47816 Spondylosis without myelopathy or radiculopathy, lumbar region: Secondary | ICD-10-CM | POA: Diagnosis not present

## 2021-07-05 DIAGNOSIS — M5116 Intervertebral disc disorders with radiculopathy, lumbar region: Secondary | ICD-10-CM | POA: Diagnosis not present

## 2021-07-05 DIAGNOSIS — M5126 Other intervertebral disc displacement, lumbar region: Secondary | ICD-10-CM | POA: Diagnosis not present

## 2021-07-05 DIAGNOSIS — M545 Low back pain, unspecified: Secondary | ICD-10-CM | POA: Diagnosis not present

## 2021-07-06 DIAGNOSIS — G894 Chronic pain syndrome: Secondary | ICD-10-CM | POA: Diagnosis not present

## 2021-07-06 DIAGNOSIS — M47816 Spondylosis without myelopathy or radiculopathy, lumbar region: Secondary | ICD-10-CM | POA: Diagnosis not present

## 2021-07-07 DIAGNOSIS — M5116 Intervertebral disc disorders with radiculopathy, lumbar region: Secondary | ICD-10-CM | POA: Diagnosis not present

## 2021-07-16 DIAGNOSIS — M4716 Other spondylosis with myelopathy, lumbar region: Secondary | ICD-10-CM | POA: Diagnosis not present

## 2021-07-16 DIAGNOSIS — M5116 Intervertebral disc disorders with radiculopathy, lumbar region: Secondary | ICD-10-CM | POA: Diagnosis not present

## 2021-07-16 DIAGNOSIS — T84216A Breakdown (mechanical) of internal fixation device of vertebrae, initial encounter: Secondary | ICD-10-CM | POA: Diagnosis not present

## 2021-07-16 DIAGNOSIS — M48062 Spinal stenosis, lumbar region with neurogenic claudication: Secondary | ICD-10-CM | POA: Diagnosis not present

## 2021-07-28 DIAGNOSIS — E785 Hyperlipidemia, unspecified: Secondary | ICD-10-CM | POA: Diagnosis not present

## 2021-07-28 DIAGNOSIS — Z01818 Encounter for other preprocedural examination: Secondary | ICD-10-CM | POA: Diagnosis not present

## 2021-07-28 DIAGNOSIS — R7303 Prediabetes: Secondary | ICD-10-CM | POA: Diagnosis not present

## 2021-07-28 DIAGNOSIS — Z6830 Body mass index (BMI) 30.0-30.9, adult: Secondary | ICD-10-CM | POA: Diagnosis not present

## 2021-07-28 DIAGNOSIS — I10 Essential (primary) hypertension: Secondary | ICD-10-CM | POA: Diagnosis not present

## 2021-08-28 DIAGNOSIS — M48062 Spinal stenosis, lumbar region with neurogenic claudication: Secondary | ICD-10-CM | POA: Diagnosis not present

## 2021-08-28 DIAGNOSIS — M5116 Intervertebral disc disorders with radiculopathy, lumbar region: Secondary | ICD-10-CM | POA: Diagnosis not present

## 2021-08-28 DIAGNOSIS — T84216A Breakdown (mechanical) of internal fixation device of vertebrae, initial encounter: Secondary | ICD-10-CM | POA: Diagnosis not present

## 2021-09-01 DIAGNOSIS — Z01812 Encounter for preprocedural laboratory examination: Secondary | ICD-10-CM | POA: Diagnosis not present

## 2021-09-01 DIAGNOSIS — Z01810 Encounter for preprocedural cardiovascular examination: Secondary | ICD-10-CM | POA: Diagnosis not present

## 2021-09-01 DIAGNOSIS — M4716 Other spondylosis with myelopathy, lumbar region: Secondary | ICD-10-CM | POA: Diagnosis not present

## 2021-09-01 DIAGNOSIS — R9431 Abnormal electrocardiogram [ECG] [EKG]: Secondary | ICD-10-CM | POA: Diagnosis not present

## 2021-09-08 DIAGNOSIS — Z981 Arthrodesis status: Secondary | ICD-10-CM | POA: Diagnosis not present

## 2021-09-08 DIAGNOSIS — M4326 Fusion of spine, lumbar region: Secondary | ICD-10-CM | POA: Diagnosis not present

## 2021-09-08 DIAGNOSIS — M5126 Other intervertebral disc displacement, lumbar region: Secondary | ICD-10-CM | POA: Diagnosis not present

## 2021-09-08 DIAGNOSIS — M5116 Intervertebral disc disorders with radiculopathy, lumbar region: Secondary | ICD-10-CM | POA: Diagnosis not present

## 2021-09-08 DIAGNOSIS — M4716 Other spondylosis with myelopathy, lumbar region: Secondary | ICD-10-CM | POA: Diagnosis not present

## 2021-09-08 DIAGNOSIS — M4726 Other spondylosis with radiculopathy, lumbar region: Secondary | ICD-10-CM | POA: Diagnosis not present

## 2021-09-08 DIAGNOSIS — M545 Low back pain, unspecified: Secondary | ICD-10-CM | POA: Diagnosis not present

## 2021-09-08 DIAGNOSIS — T84216A Breakdown (mechanical) of internal fixation device of vertebrae, initial encounter: Secondary | ICD-10-CM | POA: Diagnosis not present

## 2021-09-08 DIAGNOSIS — M48062 Spinal stenosis, lumbar region with neurogenic claudication: Secondary | ICD-10-CM | POA: Diagnosis not present

## 2021-09-08 DIAGNOSIS — M4306 Spondylolysis, lumbar region: Secondary | ICD-10-CM | POA: Diagnosis not present

## 2021-09-08 HISTORY — PX: LUMBAR SPINE SURGERY: SHX701

## 2021-09-09 DIAGNOSIS — M48062 Spinal stenosis, lumbar region with neurogenic claudication: Secondary | ICD-10-CM | POA: Diagnosis not present

## 2021-09-09 DIAGNOSIS — M5116 Intervertebral disc disorders with radiculopathy, lumbar region: Secondary | ICD-10-CM | POA: Diagnosis not present

## 2021-09-09 DIAGNOSIS — M4726 Other spondylosis with radiculopathy, lumbar region: Secondary | ICD-10-CM | POA: Diagnosis not present

## 2021-09-09 DIAGNOSIS — M545 Low back pain, unspecified: Secondary | ICD-10-CM | POA: Diagnosis not present

## 2021-10-07 DIAGNOSIS — M4326 Fusion of spine, lumbar region: Secondary | ICD-10-CM | POA: Diagnosis not present

## 2021-10-26 DIAGNOSIS — R7303 Prediabetes: Secondary | ICD-10-CM | POA: Diagnosis not present

## 2021-10-26 DIAGNOSIS — M545 Low back pain, unspecified: Secondary | ICD-10-CM | POA: Diagnosis not present

## 2021-10-26 DIAGNOSIS — G8929 Other chronic pain: Secondary | ICD-10-CM | POA: Diagnosis not present

## 2021-10-26 DIAGNOSIS — I10 Essential (primary) hypertension: Secondary | ICD-10-CM | POA: Diagnosis not present

## 2021-10-26 DIAGNOSIS — Z6829 Body mass index (BMI) 29.0-29.9, adult: Secondary | ICD-10-CM | POA: Diagnosis not present

## 2021-11-05 DIAGNOSIS — Z79891 Long term (current) use of opiate analgesic: Secondary | ICD-10-CM | POA: Diagnosis not present

## 2021-11-05 DIAGNOSIS — Z79899 Other long term (current) drug therapy: Secondary | ICD-10-CM | POA: Diagnosis not present

## 2021-11-05 DIAGNOSIS — G894 Chronic pain syndrome: Secondary | ICD-10-CM | POA: Diagnosis not present

## 2021-11-23 DIAGNOSIS — Z6829 Body mass index (BMI) 29.0-29.9, adult: Secondary | ICD-10-CM | POA: Diagnosis not present

## 2021-11-23 DIAGNOSIS — G8929 Other chronic pain: Secondary | ICD-10-CM | POA: Diagnosis not present

## 2021-11-23 DIAGNOSIS — M545 Low back pain, unspecified: Secondary | ICD-10-CM | POA: Diagnosis not present

## 2021-12-02 DIAGNOSIS — M4326 Fusion of spine, lumbar region: Secondary | ICD-10-CM | POA: Diagnosis not present

## 2022-01-29 DIAGNOSIS — Z6829 Body mass index (BMI) 29.0-29.9, adult: Secondary | ICD-10-CM | POA: Diagnosis not present

## 2022-01-29 DIAGNOSIS — R7303 Prediabetes: Secondary | ICD-10-CM | POA: Diagnosis not present

## 2022-01-29 DIAGNOSIS — M545 Low back pain, unspecified: Secondary | ICD-10-CM | POA: Diagnosis not present

## 2022-01-29 DIAGNOSIS — I10 Essential (primary) hypertension: Secondary | ICD-10-CM | POA: Diagnosis not present

## 2022-01-29 DIAGNOSIS — E785 Hyperlipidemia, unspecified: Secondary | ICD-10-CM | POA: Diagnosis not present

## 2022-02-23 DIAGNOSIS — Z23 Encounter for immunization: Secondary | ICD-10-CM | POA: Diagnosis not present

## 2022-03-04 DIAGNOSIS — M47816 Spondylosis without myelopathy or radiculopathy, lumbar region: Secondary | ICD-10-CM | POA: Diagnosis not present

## 2022-03-04 DIAGNOSIS — M4326 Fusion of spine, lumbar region: Secondary | ICD-10-CM | POA: Diagnosis not present

## 2022-03-21 DIAGNOSIS — Z79891 Long term (current) use of opiate analgesic: Secondary | ICD-10-CM | POA: Diagnosis not present

## 2022-03-21 DIAGNOSIS — Z9682 Presence of neurostimulator: Secondary | ICD-10-CM | POA: Diagnosis not present

## 2022-03-21 DIAGNOSIS — M549 Dorsalgia, unspecified: Secondary | ICD-10-CM | POA: Diagnosis not present

## 2022-03-21 DIAGNOSIS — I4510 Unspecified right bundle-branch block: Secondary | ICD-10-CM | POA: Diagnosis not present

## 2022-03-21 DIAGNOSIS — M940 Chondrocostal junction syndrome [Tietze]: Secondary | ICD-10-CM | POA: Diagnosis not present

## 2022-03-21 DIAGNOSIS — Z79899 Other long term (current) drug therapy: Secondary | ICD-10-CM | POA: Diagnosis not present

## 2022-03-21 DIAGNOSIS — E559 Vitamin D deficiency, unspecified: Secondary | ICD-10-CM | POA: Diagnosis not present

## 2022-03-21 DIAGNOSIS — R079 Chest pain, unspecified: Secondary | ICD-10-CM | POA: Diagnosis not present

## 2022-03-21 DIAGNOSIS — R0602 Shortness of breath: Secondary | ICD-10-CM | POA: Diagnosis not present

## 2022-03-21 DIAGNOSIS — R0789 Other chest pain: Secondary | ICD-10-CM | POA: Diagnosis not present

## 2022-03-21 DIAGNOSIS — G8929 Other chronic pain: Secondary | ICD-10-CM | POA: Diagnosis not present

## 2022-03-21 DIAGNOSIS — K219 Gastro-esophageal reflux disease without esophagitis: Secondary | ICD-10-CM | POA: Diagnosis not present

## 2022-03-21 DIAGNOSIS — I10 Essential (primary) hypertension: Secondary | ICD-10-CM | POA: Diagnosis not present

## 2022-03-22 DIAGNOSIS — I4519 Other right bundle-branch block: Secondary | ICD-10-CM | POA: Diagnosis not present

## 2022-03-22 DIAGNOSIS — I4510 Unspecified right bundle-branch block: Secondary | ICD-10-CM | POA: Diagnosis not present

## 2022-03-22 DIAGNOSIS — R079 Chest pain, unspecified: Secondary | ICD-10-CM | POA: Diagnosis not present

## 2022-03-26 DIAGNOSIS — R079 Chest pain, unspecified: Secondary | ICD-10-CM | POA: Diagnosis not present

## 2022-03-26 DIAGNOSIS — Z6829 Body mass index (BMI) 29.0-29.9, adult: Secondary | ICD-10-CM | POA: Diagnosis not present

## 2022-03-30 ENCOUNTER — Encounter: Payer: Self-pay | Admitting: Cardiology

## 2022-03-30 ENCOUNTER — Encounter: Payer: Self-pay | Admitting: *Deleted

## 2022-03-31 ENCOUNTER — Ambulatory Visit: Payer: Medicare Other | Attending: Cardiology | Admitting: Cardiology

## 2022-03-31 ENCOUNTER — Encounter: Payer: Self-pay | Admitting: Cardiology

## 2022-03-31 ENCOUNTER — Ambulatory Visit (INDEPENDENT_AMBULATORY_CARE_PROVIDER_SITE_OTHER): Payer: Medicare Other

## 2022-03-31 VITALS — BP 144/90 | HR 62 | Ht 73.0 in | Wt 229.4 lb

## 2022-03-31 DIAGNOSIS — R7303 Prediabetes: Secondary | ICD-10-CM | POA: Insufficient documentation

## 2022-03-31 DIAGNOSIS — E785 Hyperlipidemia, unspecified: Secondary | ICD-10-CM | POA: Insufficient documentation

## 2022-03-31 DIAGNOSIS — R0789 Other chest pain: Secondary | ICD-10-CM | POA: Diagnosis not present

## 2022-03-31 DIAGNOSIS — I10 Essential (primary) hypertension: Secondary | ICD-10-CM | POA: Insufficient documentation

## 2022-03-31 DIAGNOSIS — R072 Precordial pain: Secondary | ICD-10-CM | POA: Insufficient documentation

## 2022-03-31 LAB — ECHOCARDIOGRAM COMPLETE
Area-P 1/2: 4.1 cm2
Height: 73 in
S' Lateral: 4 cm
Weight: 3670.4 oz

## 2022-03-31 NOTE — Progress Notes (Unsigned)
Cardiology Consultation:    Date:  03/31/2022   ID:  KORON GODEAUX, DOB 06-Mar-1971, MRN [id number]  PCP:  Doran Stabler, NP  Cardiologist:  Gypsy Balsam, MD   Referring MD: Mikki Santee Key, *   Chief Complaint  Patient presents with   Abnormal ECG    Right Bundle Branch Block Chest pain continues    History of Present Illness:    [name] is a 51 y.o. male who is being seen today for the evaluation of chest pain at the request of Mikki Santee Key, *.  Past medical history significant for essential hypertension, dyslipidemia, GERD, fatty liver, history of pancreatitis, osteoarthritis with lumbar and spine surgery, laminectomy drawn twice, pain stimulator.  Recently he had to go to the emergency room because of chest pain.  He describes the pain as tightness squeezing pressure burning in the chest happening with radiation towards the left side of his chest interestingly this sensation has been there since last Saturday.  He and up going to the emergency room he was admitted D-dimer was negative troponins high-sensitivity were negative, he eventually end up having stress test he did 4 stages of stressing without EKG changes.  He was referred to [place] for evaluation of his chest pain.  Still complaining of having pain.  He said the pain is 8-9 partially reproducible by pressing chest wall.  Note he was given Motrin with some relief but not complete.  At the same time he can walk and do things with no major difficulties. His risk factors for coronary artery disease include prediabetes, hypertension, dyslipidemia with LDL 97, he never smoked does not have family history.  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 Mild pancolonic diverticulosis.    ESOPHAGOGASTRODUODENOSCOPY  07/08/2008   Mild gastritis. Minimal hiatal hernia.    HERNIA REPAIR Right 11/12/2019   Umbilical and Right inguinal Hernia repair with mesh   KNEE SURGERY Left 1998   Lower back surgery     2013 and 2018   LUMBAR SPINE SURGERY  09/08/2021   Lower Lumbar Discectomy and fusion   POLYPECTOMY     right knee surgery  2005   UPPER GASTROINTESTINAL ENDOSCOPY     VEIN LIGATION AND STRIPPING Bilateral    WISDOM TOOTH EXTRACTION      Current Medications: Current Meds  Medication Sig   amLODipine (NORVASC) 10 MG tablet Take 10 mg by mouth daily.   Cyanocobalamin (VITAMIN B-12 SL) Place 5,000 mcg under the tongue once a week.   Diclofenac Sodium CR 100 MG 24 hr tablet Take 100 mg by mouth daily.   ibuprofen (ADVIL) 800 MG tablet Take 800 mg by mouth 3 (three) times daily.   metFORMIN (GLUCOPHAGE-XR) 500 MG 24 hr tablet Take 500 mg by mouth daily.   oxyCODONE-acetaminophen (PERCOCET) 10-325 MG tablet Take 1 tablet by mouth daily.   pantoprazole (PROTONIX) 40 MG tablet Take  40 mg by mouth daily.   Vitamin D, Ergocalciferol, (DRISDOL) 1.25 MG (50000 UNIT) CAPS capsule Take 50,000 Units by mouth every 7 (seven) days.     Allergies:   Vancomycin   Social History   Socioeconomic History   Marital status: Married    Spouse name: Not on file   Number of children: 2   Years of education: Not on file   Highest education level: Not on file  Occupational History   Not on file  Tobacco Use   Smoking status: Never   Smokeless tobacco: Never  Vaping Use   Vaping Use: Never used  Substance and Sexual Activity   Alcohol use: Never   Drug use: Never   Sexual activity: Not on file  Other  Topics Concern   Not on file  Social History Narrative   Not on file   Social Determinants of Health   Financial Resource Strain: Not on file  Food Insecurity: Not on file  Transportation Needs: Not on file  Physical Activity: Not on file  Stress: Not on file  Social Connections: Not on file     Family History: The patient's family history includes Diabetes in his father; Hypertension in his father; Kidney disease in his father; Stomach cancer in his father. There is no history of Esophageal cancer, Rectal cancer, or Colon cancer. ROS:   Please see the history of present illness.    All 14 point review of systems negative except as described per history of present illness.  EKGs/Labs/Other Studies Reviewed:    The following studies were reviewed today: I did review record from [facility] including stress test which was negative.  EKG:  EKG is  ordered today.  The ekg ordered today demonstrates incomplete right bundle branch block normal sinus rhythm nonspecific ST segment changes  Recent Labs: No results found for requested labs within last 365 days.  Recent Lipid Panel    Component Value Date/Time   CHOL  05/31/2008 2230    107        ATP III CLASSIFICATION:  <200     mg/dL   Desirable  144-818  mg/dL   Borderline High  >=563    mg/dL   High          TRIG 149 05/31/2008 2230   HDL 31 (L) 05/31/2008 2230   CHOLHDL 3.5 05/31/2008 2230   VLDL 22 05/31/2008 2230   LDLCALC  05/31/2008 2230    54        Total Cholesterol/HDL:CHD Risk Coronary Heart Disease Risk Table                     Men   Women  1/2 Average Risk   3.4   3.3  Average Risk       5.0   4.4  2 X Average Risk   9.6   7.1  3 X Average Risk  23.4   11.0        Use the calculated Patient Ratio above and the CHD Risk Table to determine the patient's CHD Risk.        ATP III CLASSIFICATION (LDL):  <100     mg/dL   Optimal  702-637  mg/dL   Near or Above                    Optimal  130-159  mg/dL    Borderline  858-850  mg/dL   High  >277  mg/dL   Very High    Physical Exam:    VS:  BP (!) 144/90 (BP Location: Left Arm, Patient Position: Sitting)   Pulse 62   Ht 6\' 1"  (1.854 m)   Wt 229 lb 6.4 oz (104.1 kg)   SpO2 98%   BMI 30.27 kg/m     Wt Readings from Last 3 Encounters:  03/31/22 229 lb 6.4 oz (104.1 kg)  03/26/22 226 lb (102.5 kg)  03/10/18 241 lb (109.3 kg)     GEN:  Well nourished, well developed in no acute distress HEENT: Normal NECK: No JVD; No carotid bruits LYMPHATICS: No lymphadenopathy CARDIAC: RRR, no murmurs, no rubs, no gallops RESPIRATORY:  Clear to auscultation without rales, wheezing or rhonchi  ABDOMEN: Soft, non-tender, non-distended MUSCULOSKELETAL:  No edema; No deformity  SKIN: Warm and dry NEUROLOGIC:  Alert and oriented x 3 PSYCHIATRIC:  Normal affect   ASSESSMENT:    1. Atypical chest pain   2. Prediabetes   3. Dyslipidemia   4. Essential hypertension    PLAN:    In order of problems listed above:  Atypical chest pain.  Biochemical markers negative it was done in form of high-sensitivity troponins, stress test done just few days ago he did 4 stages with no ischemic changes.  Pain is ongoing continuous, I suspect it may be related to costochondritis, he also got chronic back problem which may be contributing to his symptomatology as well.  I will ask him to have an echocardiogram to make sure structurally his heart is normal especially in view of the fact that he does have incomplete right bundle branch block.  Otherwise from cardiac point of view we will concentrate on risk factors modifications Essential hypertension blood pressure elevated today but this is first visit in my office I see him back in about a month to 6 weeks and then we adjust medication if needed. Dyslipidemia with his prediabetes there will be some value of initiating statin his LDL is 97.  I initiated conversation about it. Prediabetes that being followed by  internal medicine team, he is taking metformin already.  Overall is quite a frustrating story.  I think there is some value in doing CT angio of his aorta to make sure we not missing any aortic pathology.  Will schedule the test  Medication Adjustments/Labs and Tests Ordered: Current medicines are reviewed at length with the patient today.  Concerns regarding medicines are outlined above.  No orders of the defined types were placed in this encounter.  No orders of the defined types were placed in this encounter.   Signed, 13/08/19, MD, [facility]. 03/31/2022 9:51 AM    [facility]

## 2022-03-31 NOTE — Patient Instructions (Addendum)
Medication Instructions:  Your physician recommends that you continue on your current medications as directed. Please refer to the Current Medication list given to you today.  *If you need a refill on your cardiac medications before your next appointment, please call your pharmacy*   Lab Work:  If you have labs (blood work) drawn today and your tests are completely normal, you will receive your results only by: MyChart Message (if you have MyChart) OR A paper copy in the mail If you have any lab test that is abnormal or we need to change your treatment, we will call you to review the results.   Testing/Procedures: Non-Cardiac CT Angiography (CTA), is a special type of CT scan that uses a computer to produce multi-dimensional views of major blood vessels throughout the body. In CT angiography, a contrast material is injected through an IV to help visualize the blood vessels   Your physician has requested that you have an echocardiogram. Echocardiography is a painless test that uses sound waves to create images of your heart. It provides your doctor with information about the size and shape of your heart and how well your heart's chambers and valves are working. This procedure takes approximately one hour. There are no restrictions for this procedure. Please do NOT wear cologne, perfume, aftershave, or lotions (deodorant is allowed). Please arrive 15 minutes prior to your appointment time.    Follow-Up: At [facility], you and your health needs are our priority.  As part of our continuing mission to provide you with exceptional heart care, we have created designated Provider Care Teams.  These Care Teams include your primary Cardiologist (physician) and Advanced Practice Providers (APPs -  Physician Assistants and Nurse Practitioners) who all work together to provide you with the care you need, when you need it.  We recommend signing up for the patient portal called "MyChart".  Sign up  information is provided on this After Visit Summary.  MyChart is used to connect with patients for Virtual Visits (Telemedicine).  Patients are able to view lab/test results, encounter notes, upcoming appointments, etc.  Non-urgent messages can be sent to your provider as well.   To learn more about what you can do with MyChart, go to [email or web address].    Your next appointment:   6 week(s)  The format for your next appointment:   In Person  Provider:   Gypsy Balsam, MD    Other Instructions NA

## 2022-04-05 ENCOUNTER — Telehealth: Payer: Self-pay | Admitting: Cardiology

## 2022-04-05 NOTE — Telephone Encounter (Signed)
Patient is returning call to discuss echo results. °

## 2022-04-05 NOTE — Telephone Encounter (Signed)
Patient calling back.   °

## 2022-04-05 NOTE — Telephone Encounter (Signed)
Results reviewed with pt as per Dr. Krasowski's note.  Pt verbalized understanding and had no additional questions. Routed to PCP  

## 2022-04-16 ENCOUNTER — Ambulatory Visit (HOSPITAL_BASED_OUTPATIENT_CLINIC_OR_DEPARTMENT_OTHER)
Admission: RE | Admit: 2022-04-16 | Discharge: 2022-04-16 | Disposition: A | Discharge status: Home / Self Care | Payer: Medicare Other | Source: Ambulatory Visit | Attending: Cardiology | Admitting: Cardiology

## 2022-04-16 DIAGNOSIS — R072 Precordial pain: Secondary | ICD-10-CM | POA: Diagnosis not present

## 2022-04-16 DIAGNOSIS — R079 Chest pain, unspecified: Secondary | ICD-10-CM | POA: Diagnosis not present

## 2022-04-16 MED ORDER — IOHEXOL 350 MG/ML SOLN
100.0000 mL | Freq: Once | INTRAVENOUS | Status: AC | PRN
  Administered 2022-04-16: 100 mL via INTRAVENOUS

## 2022-04-19 ENCOUNTER — Telehealth: Payer: Self-pay | Admitting: Cardiology

## 2022-04-19 DIAGNOSIS — E785 Hyperlipidemia, unspecified: Secondary | ICD-10-CM | POA: Diagnosis not present

## 2022-04-19 DIAGNOSIS — R7303 Prediabetes: Secondary | ICD-10-CM | POA: Diagnosis not present

## 2022-04-19 DIAGNOSIS — I10 Essential (primary) hypertension: Secondary | ICD-10-CM | POA: Diagnosis not present

## 2022-04-19 DIAGNOSIS — R079 Chest pain, unspecified: Secondary | ICD-10-CM | POA: Diagnosis not present

## 2022-04-19 DIAGNOSIS — Z6830 Body mass index (BMI) 30.0-30.9, adult: Secondary | ICD-10-CM | POA: Diagnosis not present

## 2022-04-19 DIAGNOSIS — Z125 Encounter for screening for malignant neoplasm of prostate: Secondary | ICD-10-CM | POA: Diagnosis not present

## 2022-04-19 NOTE — Telephone Encounter (Signed)
Results reviewed with pt as per Dr. Krasowski's note.  Pt verbalized understanding and had no additional questions. Routed to PCP  

## 2022-04-19 NOTE — Telephone Encounter (Signed)
Patient is requesting a call back to discuss CT results. 

## 2022-04-29 ENCOUNTER — Telehealth: Payer: Self-pay

## 2022-04-29 NOTE — Telephone Encounter (Signed)
Results reviewed with pt as per Dr. Krasowski's note.  Pt verbalized understanding and had no additional questions. Routed to PCP  

## 2022-05-14 ENCOUNTER — Encounter: Payer: Self-pay | Admitting: Cardiology

## 2022-05-14 ENCOUNTER — Ambulatory Visit: Payer: Medicare Other | Admitting: Cardiology

## 2022-05-17 ENCOUNTER — Encounter: Payer: Self-pay | Admitting: Cardiology

## 2022-05-17 ENCOUNTER — Ambulatory Visit: Payer: Medicare Other | Attending: Cardiology | Admitting: Cardiology

## 2022-05-17 VITALS — BP 128/84 | HR 68 | Ht 73.0 in | Wt 228.4 lb

## 2022-05-17 DIAGNOSIS — I10 Essential (primary) hypertension: Secondary | ICD-10-CM | POA: Diagnosis not present

## 2022-05-17 DIAGNOSIS — R7303 Prediabetes: Secondary | ICD-10-CM

## 2022-05-17 DIAGNOSIS — R0789 Other chest pain: Secondary | ICD-10-CM | POA: Diagnosis not present

## 2022-05-17 DIAGNOSIS — E785 Hyperlipidemia, unspecified: Secondary | ICD-10-CM | POA: Insufficient documentation

## 2022-05-17 MED ORDER — IBUPROFEN 400 MG PO TABS: 400.0000 mg | ORAL_TABLET | Freq: Four times a day (QID) | ORAL | 0 refills | Status: DC | PRN

## 2022-05-17 MED ORDER — LOSARTAN POTASSIUM 25 MG PO TABS: 25.0000 mg | ORAL_TABLET | Freq: Two times a day (BID) | ORAL | 3 refills | Status: DC

## 2022-05-17 NOTE — Patient Instructions (Signed)
Medication Instructions:  Your physician has recommended you make the following change in your medication:   Start Losartan 25 mg twice daily.  Take Motrin 400 mg 3 times a day for 1 week  *If you need a refill on your cardiac medications before your next appointment, please call your pharmacy*   Lab Work: Your physician recommends that you return for lab work in: 1 week for BMP. You need to have labs done when you are fasting.  You can come Monday through Friday 8:30 am to 12:00 pm and 1:15 to 4:30. You do not need to make an appointment as the order has already been placed.   If you have labs (blood work) drawn today and your tests are completely normal, you will receive your results only by: [place] (if you have MyChart) OR A paper copy in the mail If you have any lab test that is abnormal or we need to change your treatment, we will call you to review the results.   Testing/Procedures: None ordered   Follow-Up: At [facility], you and your health needs are our priority.  As part of our continuing mission to provide you with exceptional heart care, we have created designated Provider Care Teams.  These Care Teams include your primary Cardiologist (physician) and Advanced Practice Providers (APPs -  Physician Assistants and Nurse Practitioners) who all work together to provide you with the care you need, when you need it.  We recommend signing up for the patient portal called "MyChart".  Sign up information is provided on this After Visit Summary.  MyChart is used to connect with patients for Virtual Visits (Telemedicine).  Patients are able to view lab/test results, encounter notes, upcoming appointments, etc.  Non-urgent messages can be sent to your provider as well.   To learn more about what you can do with MyChart, go to [email or web address].    Your next appointment:   3 month(s)  The format for your next appointment:   In Person  Provider:   Jenne Campus, MD    Other Instructions none  Important Information About Sugar

## 2022-05-17 NOTE — Addendum Note (Signed)
Addended by: Truddie Hidden on: 05/17/2022 11:38 AM   Modules accepted: Orders

## 2022-05-17 NOTE — Progress Notes (Signed)
Cardiology Office Note:    Date:  05/17/2022   ID:  [name], DOB 29-Sep-1970, MRN [id number]  PCP:  Doran Stabler, NP  Cardiologist:  Gypsy Balsam, MD    Referring MD: Mikki Santee Key, *   No chief complaint on file. Still having chest pain  History of Present Illness:    [name] is a 52 y.o. male with past medical history significant for prediabetes, essential hypertension, dyslipidemia he was referred originally to [place] because of atypical chest pain.  Pain is not related to exercise can last all day, he did have a stress test did very well on the treadmill with 4 stages without EKG changes or stress test was negative.  He also went to the emergency room troponins were negative D-dimer was negative. He is in my office for follow-up.  Still having some chest pain.  Pain is reproducible by pressing chest wall in the upper sternum.  Also on the side of his chest.  He did have echocardiogram done in May in the meantime and he was found to have ejection fraction 50 to 55%.  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 Mild pancolonic diverticulosis.    ESOPHAGOGASTRODUODENOSCOPY  07/08/2008   Mild gastritis. Minimal hiatal hernia.    HERNIA REPAIR Right 11/12/2019   Umbilical and Right inguinal Hernia repair with mesh   KNEE SURGERY Left 1998   Lower back surgery     2013 and 2018   LUMBAR SPINE SURGERY  09/08/2021   Lower Lumbar Discectomy and fusion   POLYPECTOMY      right knee surgery  2005   UPPER GASTROINTESTINAL ENDOSCOPY     VEIN LIGATION AND STRIPPING Bilateral    WISDOM TOOTH EXTRACTION      Current Medications: Current Meds  Medication Sig   amLODipine (NORVASC) 10 MG tablet Take 10 mg by mouth daily.   Cyanocobalamin (VITAMIN B-12 SL) Place 5,000 mcg under the tongue once a week.   Diclofenac Sodium CR 100 MG 24 hr tablet Take 100 mg by mouth daily.   ibuprofen (ADVIL) 800 MG tablet Take 800 mg by mouth 3 (three) times daily.   metFORMIN (GLUCOPHAGE-XR) 500 MG 24 hr tablet Take 500 mg by mouth daily.   oxyCODONE-acetaminophen (PERCOCET) 10-325 MG tablet Take 1 tablet by mouth daily.   pantoprazole (PROTONIX) 40 MG tablet Take 40 mg by mouth daily.   Vitamin D, Ergocalciferol, (DRISDOL) 1.25 MG (50000 UNIT) CAPS capsule Take 50,000 Units by mouth every 7 (seven) days.     Allergies:   Vancomycin   Social History   Socioeconomic History   Marital status: Married    Spouse name: Not on file   Number of children: 2   Years of education: Not on file   Highest education level: Not on file  Occupational History   Not on file  Tobacco Use   Smoking status: Never  Smokeless tobacco: Never  Vaping Use   Vaping Use: Never used  Substance and Sexual Activity   Alcohol use: Never   Drug use: Never   Sexual activity: Not on file  Other Topics Concern   Not on file  Social History Narrative   Not on file   Social Determinants of Health   Financial Resource Strain: Not on file  Food Insecurity: Not on file  Transportation Needs: Not on file  Physical Activity: Not on file  Stress: Not on file  Social Connections: Not on file     Family History: The patient's family history includes Diabetes in his father; Hypertension in his father; Kidney disease in his father; Stomach cancer in his father. There is no history of Esophageal cancer, Rectal cancer, or Colon cancer. ROS:   Please see the history of present illness.    All 14  point review of systems negative except as described per history of present illness  EKGs/Labs/Other Studies Reviewed:      Recent Labs: No results found for requested labs within last 365 days.  Recent Lipid Panel    Component Value Date/Time   CHOL  05/31/2008 2230    107        ATP III CLASSIFICATION:  <200     mg/dL   Desirable  200-239  mg/dL   Borderline High  >=240    mg/dL   High          TRIG 109 05/31/2008 2230   HDL 31 (L) 05/31/2008 2230   CHOLHDL 3.5 05/31/2008 2230   VLDL 22 05/31/2008 2230   LDLCALC  05/31/2008 2230    54        Total Cholesterol/HDL:CHD Risk Coronary Heart Disease Risk Table                     Men   Women  1/2 Average Risk   3.4   3.3  Average Risk       5.0   4.4  2 X Average Risk   9.6   7.1  3 X Average Risk  23.4   11.0        Use the calculated Patient Ratio above and the CHD Risk Table to determine the patient's CHD Risk.        ATP III CLASSIFICATION (LDL):  <100     mg/dL   Optimal  100-129  mg/dL   Near or Above                    Optimal  130-159  mg/dL   Borderline  160-189  mg/dL   High  >190     mg/dL   Very High    Physical Exam:    VS:  BP 128/84   Pulse 68   Ht 6\' 1"  (1.854 m)   Wt 228 lb 6.4 oz (103.6 kg)   SpO2 99%   BMI 30.13 kg/m     Wt Readings from Last 3 Encounters:  05/17/22 228 lb 6.4 oz (103.6 kg)  05/14/22 229 lb (103.9 kg)  03/31/22 229 lb 6.4 oz (104.1 kg)     GEN:  Well nourished, well developed in no acute distress HEENT: Normal NECK: No JVD; No carotid bruits LYMPHATICS: No lymphadenopathy CARDIAC: RRR, no murmurs, no rubs, no gallops RESPIRATORY:  Clear to auscultation without rales, wheezing or rhonchi  ABDOMEN: Soft, non-tender, non-distended MUSCULOSKELETAL:  No edema; No deformity  SKIN: Warm and dry LOWER  EXTREMITIES: no swelling NEUROLOGIC:  Alert and oriented x 3 PSYCHIATRIC:  Normal affect   ASSESSMENT:    1. Atypical chest pain   2. Dyslipidemia   3. Prediabetes    4. Essential hypertension    PLAN:    In order of problems listed above:  Atypical chest pain.  Ask him to start taking Motrin on the regular basis I want him to take 400 mg of Motrin 3 times a day for about a week straight but I want told him also he need to take it on a full stomach and see if he can improve his symptomatology. Echocardiogram showed ejection fraction lower limits of normal.  I will initiate ARB, will start losartan 25 twice daily, Chem-7 need to be done next week. Prediabetes noted.  ARB will be beneficial for renal protective effect which I will continue. Dyslipidemia I did review his K PN which show me his LDL of 92.  I will not add any statin at this setting.  I do want simply due to many things at once Will talk about potentially adding statins   Medication Adjustments/Labs and Tests Ordered: Current medicines are reviewed at length with the patient today.  Concerns regarding medicines are outlined above.  No orders of the defined types were placed in this encounter.  Medication changes: No orders of the defined types were placed in this encounter.   Signed, Park Liter, MD, [facility] 05/17/2022 11:28 AM    [place]

## 2022-06-22 DIAGNOSIS — R7303 Prediabetes: Secondary | ICD-10-CM | POA: Diagnosis not present

## 2022-06-22 DIAGNOSIS — M545 Low back pain, unspecified: Secondary | ICD-10-CM | POA: Diagnosis not present

## 2022-06-22 DIAGNOSIS — G8929 Other chronic pain: Secondary | ICD-10-CM | POA: Diagnosis not present

## 2022-06-22 DIAGNOSIS — I10 Essential (primary) hypertension: Secondary | ICD-10-CM | POA: Diagnosis not present

## 2022-06-22 DIAGNOSIS — Z6830 Body mass index (BMI) 30.0-30.9, adult: Secondary | ICD-10-CM | POA: Diagnosis not present

## 2022-08-30 ENCOUNTER — Ambulatory Visit: Payer: 59 | Admitting: Cardiology

## 2022-09-13 ENCOUNTER — Other Ambulatory Visit: Payer: Self-pay | Admitting: Orthopedic Surgery

## 2022-09-13 DIAGNOSIS — M4326 Fusion of spine, lumbar region: Secondary | ICD-10-CM

## 2022-10-01 ENCOUNTER — Ambulatory Visit
Admission: RE | Admit: 2022-10-01 | Discharge: 2022-10-01 | Disposition: A | Discharge status: Home / Self Care | Payer: 59 | Source: Ambulatory Visit | Attending: Orthopedic Surgery | Admitting: Orthopedic Surgery

## 2022-10-01 DIAGNOSIS — M4326 Fusion of spine, lumbar region: Secondary | ICD-10-CM

## 2022-10-01 MED ORDER — MEPERIDINE HCL 50 MG/ML IJ SOLN
50.0000 mg | Freq: Once | INTRAMUSCULAR | Status: AC | PRN
  Administered 2022-10-01: 50 mg via INTRAMUSCULAR

## 2022-10-01 MED ORDER — DIAZEPAM 5 MG PO TABS
10.0000 mg | ORAL_TABLET | Freq: Once | ORAL | Status: AC
  Administered 2022-10-01: 10 mg via ORAL

## 2022-10-01 MED ORDER — ONDANSETRON HCL 4 MG/2ML IJ SOLN
4.0000 mg | Freq: Once | INTRAMUSCULAR | Status: AC | PRN
  Administered 2022-10-01: 4 mg via INTRAMUSCULAR

## 2022-10-01 MED ORDER — IOPAMIDOL (ISOVUE-M 200) INJECTION 41%
20.0000 mL | Freq: Once | INTRAMUSCULAR | Status: AC
  Administered 2022-10-01: 20 mL via INTRATHECAL

## 2022-10-01 NOTE — Progress Notes (Signed)
Pt has spinal cord stimulator and reports he has turned it off for her CT myelogram procedure.  

## 2022-10-01 NOTE — Discharge Instr - Other Info (Addendum)
1004: pt reports pain 10/10 from myelogram procedure. Pain in lower back. See MAR 1025: pt reports relief in pain. Pt ranking pain 2/10. Resting in recovery area until d/ced.

## 2022-10-01 NOTE — Discharge Instructions (Signed)

## 2022-11-15 ENCOUNTER — Ambulatory Visit: Payer: 59 | Attending: Cardiology | Admitting: Cardiology

## 2022-11-15 ENCOUNTER — Encounter: Payer: Self-pay | Admitting: Cardiology

## 2022-11-15 VITALS — BP 130/82 | HR 69 | Ht 73.0 in | Wt 230.4 lb

## 2022-11-15 DIAGNOSIS — R7303 Prediabetes: Secondary | ICD-10-CM

## 2022-11-15 DIAGNOSIS — I10 Essential (primary) hypertension: Secondary | ICD-10-CM | POA: Diagnosis not present

## 2022-11-15 DIAGNOSIS — R0789 Other chest pain: Secondary | ICD-10-CM | POA: Diagnosis not present

## 2022-11-15 DIAGNOSIS — E785 Hyperlipidemia, unspecified: Secondary | ICD-10-CM | POA: Diagnosis not present

## 2022-11-15 NOTE — Patient Instructions (Signed)

## 2022-11-15 NOTE — Progress Notes (Signed)
Cardiology Office Note:    Date:  11/15/2022   ID:  [name], DOB 12-Mar-1971, MRN [id number]  PCP:  Doran Stabler, NP  Cardiologist:  Gypsy Balsam, MD    Referring MD: Mikki Santee Key, *   Chief Complaint  Patient presents with   Follow-up    History of Present Illness:    [name] is a 52 y.o. male with past medical history significant for prediabetes, essential hypertension, dyslipidemia.  He was referred to [place] because of atypical chest pain.  Pain was reproducible by pressing chest wall could last all day also he did have a stress test we will continue for standard without EKG changes basically test was negative.  After that I gave him some trial of oral nonsteroidal anti-inflammatory medication form of Motrin and that seems to be improved quite significantly he does have pain anymore.  He does not take any medication for it anymore.  Overall doing very well.  Described the fact he had treadmill at home he is trying to walk some does have some chronic back problem with slow him down.  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 Mild pancolonic diverticulosis.    ESOPHAGOGASTRODUODENOSCOPY  07/08/2008   Mild gastritis. Minimal hiatal hernia.    HERNIA REPAIR Right 11/12/2019   Umbilical and Right inguinal Hernia repair with mesh   KNEE SURGERY Left 1998   Lower back surgery     2013 and 2018   LUMBAR SPINE SURGERY  09/08/2021   Lower Lumbar Discectomy and fusion    POLYPECTOMY     right knee surgery  2005   UPPER GASTROINTESTINAL ENDOSCOPY     VEIN LIGATION AND STRIPPING Bilateral    WISDOM TOOTH EXTRACTION      Current Medications: Current Meds  Medication Sig   amLODipine (NORVASC) 10 MG tablet Take 10 mg by mouth daily.   Cyanocobalamin (VITAMIN B-12 SL) Place 5,000 mcg under the tongue once a week.   Diclofenac Sodium CR 100 MG 24 hr tablet Take 100 mg by mouth daily.   ibuprofen (ADVIL) 400 MG tablet Take 1 tablet (400 mg total) by mouth every 6 (six) hours as needed. (Patient taking differently: Take 400 mg by mouth every 6 (six) hours as needed for mild pain or moderate pain.)   losartan (COZAAR) 25 MG tablet Take 1 tablet (25 mg total) by mouth 2 (two) times daily.   metFORMIN (GLUCOPHAGE-XR) 500 MG 24 hr tablet Take 500 mg by mouth daily.   oxyCODONE-acetaminophen (PERCOCET) 10-325 MG tablet Take 1 tablet by mouth daily.   pantoprazole (PROTONIX) 40 MG tablet Take 40 mg by mouth daily.   Vitamin D, Ergocalciferol, (DRISDOL) 1.25 MG (50000 UNIT) CAPS capsule Take 50,000 Units by mouth every 7 (seven) days.     Allergies:   Vancomycin   Social History   Socioeconomic History   Marital status: Married    Spouse  name: Not on file   Number of children: 2   Years of education: Not on file   Highest education level: Not on file  Occupational History   Not on file  Tobacco Use   Smoking status: Never   Smokeless tobacco: Never  Vaping Use   Vaping status: Never Used  Substance and Sexual Activity   Alcohol use: Never   Drug use: Never   Sexual activity: Not on file  Other Topics Concern   Not on file  Social History Narrative   Not on file   Social Determinants of Health   Financial Resource Strain: Not on file  Food Insecurity: Not on file  Transportation Needs: Not on file  Physical Activity: Not on file  Stress: Not on file  Social Connections: Not on file     Family History: The patient's family history includes  Diabetes in his father; Hypertension in his father; Kidney disease in his father; Stomach cancer in his father. There is no history of Esophageal cancer, Rectal cancer, or Colon cancer. ROS:   Please see the history of present illness.    All 14 point review of systems negative except as described per history of present illness  EKGs/Labs/Other Studies Reviewed:         Recent Labs: No results found for requested labs within last 365 days.  Recent Lipid Panel    Component Value Date/Time   CHOL  05/31/2008 2230    107        ATP III CLASSIFICATION:  <200     mg/dL   Desirable  562-130  mg/dL   Borderline High  >=865    mg/dL   High          TRIG 784 05/31/2008 2230   HDL 31 (L) 05/31/2008 2230   CHOLHDL 3.5 05/31/2008 2230   VLDL 22 05/31/2008 2230   LDLCALC  05/31/2008 2230    54        Total Cholesterol/HDL:CHD Risk Coronary Heart Disease Risk Table                     Men   Women  1/2 Average Risk   3.4   3.3  Average Risk       5.0   4.4  2 X Average Risk   9.6   7.1  3 X Average Risk  23.4   11.0        Use the calculated Patient Ratio above and the CHD Risk Table to determine the patient's CHD Risk.        ATP III CLASSIFICATION (LDL):  <100     mg/dL   Optimal  696-295  mg/dL   Near or Above                    Optimal  130-159  mg/dL   Borderline  284-132  mg/dL   High  >440     mg/dL   Very High    Physical Exam:    VS:  BP 130/82 (BP Location: Left Arm, Patient Position: Sitting)   Pulse 69   Ht 6\' 1"  (1.854 m)   Wt 230 lb 6.4 oz (104.5 kg)   SpO2 97%   BMI 30.40 kg/m     Wt Readings from Last 3 Encounters:  11/15/22 230 lb 6.4 oz (104.5 kg)  05/17/22 228 lb 6.4 oz (103.6 kg)  05/14/22 229 lb (103.9 kg)     GEN:  Well nourished, well developed in no acute distress HEENT: Normal NECK: No JVD; No carotid bruits LYMPHATICS: No lymphadenopathy CARDIAC: RRR, no murmurs, no rubs, no gallops RESPIRATORY:  Clear to auscultation without rales,  wheezing or rhonchi  ABDOMEN: Soft, non-tender, non-distended MUSCULOSKELETAL:  No edema; No deformity  SKIN: Warm and dry LOWER EXTREMITIES: no swelling NEUROLOGIC:  Alert and oriented x 3 PSYCHIATRIC:  Normal affect   ASSESSMENT:    1. Essential hypertension   2. Atypical chest pain   3. Dyslipidemia   4. Prediabetes    PLAN:    In order of problems listed above:  Essential hypertension: Blood pressure well-controlled we will continue present management. Dyslipidemia I did review his K PN which show me LDL 84 HDL 44.  Will continue present management. Prediabetes we had a long discussion about what to do with the situation I recommended to start taking 1 baby aspirin every single day as cardioprotective measure.   Medication Adjustments/Labs and Tests Ordered: Current medicines are reviewed at length with the patient today.  Concerns regarding medicines are outlined above.  No orders of the defined types were placed in this encounter.  Medication changes: No orders of the defined types were placed in this encounter.   Signed, Georgeanna Lea, MD, [facility] 11/15/2022 3:43 PM    [facility]

## 2022-12-29 ENCOUNTER — Emergency Department (HOSPITAL_COMMUNITY)
Admission: EM | Admit: 2022-12-29 | Discharge: 2022-12-29 | Disposition: A | Discharge status: Home / Self Care | Payer: 59 | Attending: Emergency Medicine | Admitting: Emergency Medicine

## 2022-12-29 ENCOUNTER — Emergency Department (HOSPITAL_COMMUNITY): Payer: 59

## 2022-12-29 ENCOUNTER — Encounter (HOSPITAL_COMMUNITY): Payer: Self-pay | Admitting: Emergency Medicine

## 2022-12-29 ENCOUNTER — Other Ambulatory Visit: Payer: Self-pay

## 2022-12-29 DIAGNOSIS — Y92410 Unspecified street and highway as the place of occurrence of the external cause: Secondary | ICD-10-CM | POA: Diagnosis not present

## 2022-12-29 DIAGNOSIS — Z79899 Other long term (current) drug therapy: Secondary | ICD-10-CM | POA: Diagnosis not present

## 2022-12-29 DIAGNOSIS — M549 Dorsalgia, unspecified: Secondary | ICD-10-CM | POA: Diagnosis present

## 2022-12-29 DIAGNOSIS — I119 Hypertensive heart disease without heart failure: Secondary | ICD-10-CM | POA: Diagnosis not present

## 2022-12-29 DIAGNOSIS — M542 Cervicalgia: Secondary | ICD-10-CM | POA: Insufficient documentation

## 2022-12-29 DIAGNOSIS — Z9682 Presence of neurostimulator: Secondary | ICD-10-CM | POA: Diagnosis not present

## 2022-12-29 DIAGNOSIS — Z981 Arthrodesis status: Secondary | ICD-10-CM | POA: Insufficient documentation

## 2022-12-29 DIAGNOSIS — K5730 Diverticulosis of large intestine without perforation or abscess without bleeding: Secondary | ICD-10-CM | POA: Insufficient documentation

## 2022-12-29 DIAGNOSIS — I10 Essential (primary) hypertension: Secondary | ICD-10-CM | POA: Insufficient documentation

## 2022-12-29 DIAGNOSIS — I700 Atherosclerosis of aorta: Secondary | ICD-10-CM | POA: Diagnosis not present

## 2022-12-29 DIAGNOSIS — R519 Headache, unspecified: Secondary | ICD-10-CM | POA: Diagnosis not present

## 2022-12-29 DIAGNOSIS — M47894 Other spondylosis, thoracic region: Secondary | ICD-10-CM | POA: Insufficient documentation

## 2022-12-29 DIAGNOSIS — R000 Tachycardia, unspecified: Secondary | ICD-10-CM | POA: Insufficient documentation

## 2022-12-29 LAB — I-STAT CHEM 8, ED
BUN: 7 mg/dL (ref 6–20)
Calcium, Ion: 1.12 mmol/L — ABNORMAL LOW (ref 1.15–1.40)
Chloride: 102 mmol/L (ref 98–111)
Creatinine, Ser: 0.8 mg/dL (ref 0.61–1.24)
Glucose, Bld: 178 mg/dL — ABNORMAL HIGH (ref 70–99)
HCT: 40 % (ref 39.0–52.0)
Hemoglobin: 13.6 g/dL (ref 13.0–17.0)
Potassium: 3.5 mmol/L (ref 3.5–5.1)
Sodium: 141 mmol/L (ref 135–145)
TCO2: 24 mmol/L (ref 22–32)

## 2022-12-29 LAB — CBC
HCT: 40.1 % (ref 39.0–52.0)
Hemoglobin: 13 g/dL (ref 13.0–17.0)
MCH: 30 pg (ref 26.0–34.0)
MCHC: 32.4 g/dL (ref 30.0–36.0)
MCV: 92.6 fL (ref 80.0–100.0)
Platelets: 334 10*3/uL (ref 150–400)
RBC: 4.33 MIL/uL (ref 4.22–5.81)
RDW: 12.6 % (ref 11.5–15.5)
WBC: 4.6 10*3/uL (ref 4.0–10.5)
nRBC: 0 % (ref 0.0–0.2)

## 2022-12-29 LAB — COMPREHENSIVE METABOLIC PANEL
ALT: 36 U/L (ref 0–44)
AST: 36 U/L (ref 15–41)
Albumin: 4 g/dL (ref 3.5–5.0)
Alkaline Phosphatase: 53 U/L (ref 38–126)
Anion gap: 14 (ref 5–15)
BUN: 6 mg/dL (ref 6–20)
CO2: 22 mmol/L (ref 22–32)
Calcium: 9.2 mg/dL (ref 8.9–10.3)
Chloride: 103 mmol/L (ref 98–111)
Creatinine, Ser: 1.07 mg/dL (ref 0.61–1.24)
GFR, Estimated: >60 mL/min (ref >60)
Glucose, Bld: 175 mg/dL — ABNORMAL HIGH (ref 70–99)
Potassium: 3.6 mmol/L (ref 3.5–5.1)
Sodium: 139 mmol/L (ref 135–145)
Total Bilirubin: 0.5 mg/dL (ref 0.3–1.2)
Total Protein: 6.9 g/dL (ref 6.5–8.1)

## 2022-12-29 LAB — URINALYSIS, ROUTINE W REFLEX MICROSCOPIC
Bilirubin Urine: NEGATIVE
Glucose, UA: NEGATIVE mg/dL
Hgb urine dipstick: NEGATIVE
Ketones, ur: NEGATIVE mg/dL
Leukocytes,Ua: NEGATIVE
Nitrite: NEGATIVE
Protein, ur: NEGATIVE mg/dL
Specific Gravity, Urine: 1.019 (ref 1.005–1.030)
pH: 7 (ref 5.0–8.0)

## 2022-12-29 LAB — ETHANOL: Alcohol, Ethyl (B): <10 mg/dL (ref <10)

## 2022-12-29 LAB — SAMPLE TO BLOOD BANK

## 2022-12-29 LAB — PROTIME-INR
INR: 1 (ref 0.8–1.2)
Prothrombin Time: 13.9 seconds (ref 11.4–15.2)

## 2022-12-29 LAB — I-STAT CG4 LACTIC ACID, ED: Lactic Acid, Venous: 2.4 mmol/L (ref 0.5–1.9)

## 2022-12-29 MED ORDER — HYDROMORPHONE HCL 1 MG/ML IJ SOLN
1.0000 mg | Freq: Once | INTRAMUSCULAR | Status: AC
  Administered 2022-12-29: 1 mg via INTRAVENOUS
  Filled 2022-12-29: qty 1

## 2022-12-29 MED ORDER — DEXAMETHASONE SODIUM PHOSPHATE 10 MG/ML IJ SOLN
10.0000 mg | Freq: Once | INTRAMUSCULAR | Status: AC
  Administered 2022-12-29: 10 mg via INTRAVENOUS
  Filled 2022-12-29: qty 1

## 2022-12-29 MED ORDER — IOHEXOL 350 MG/ML SOLN
75.0000 mL | Freq: Once | INTRAVENOUS | Status: AC | PRN
  Administered 2022-12-29: 75 mL via INTRAVENOUS

## 2022-12-29 MED ORDER — KETOROLAC TROMETHAMINE 15 MG/ML IJ SOLN
15.0000 mg | Freq: Once | INTRAMUSCULAR | Status: AC
  Administered 2022-12-29: 15 mg via INTRAVENOUS
  Filled 2022-12-29: qty 1

## 2022-12-29 MED ORDER — OXYCODONE-ACETAMINOPHEN 10-325 MG PO TABS: 1.0000 | ORAL_TABLET | Freq: Four times a day (QID) | ORAL | 0 refills | Status: DC | PRN

## 2022-12-29 MED ORDER — SODIUM CHLORIDE 0.9 % IV BOLUS
125.0000 mL | Freq: Once | INTRAVENOUS | Status: AC
  Administered 2022-12-29: 125 mL via INTRAVENOUS

## 2022-12-29 MED ORDER — TIZANIDINE HCL 4 MG PO TABS: 4.0000 mg | ORAL_TABLET | Freq: Three times a day (TID) | ORAL | 0 refills | Status: AC

## 2022-12-29 MED ORDER — DICLOFENAC EPOLAMINE 1.3 % EX PTCH
1.0000 | MEDICATED_PATCH | Freq: Two times a day (BID) | CUTANEOUS | Status: DC
  Administered 2022-12-29: 1 via TRANSDERMAL
  Filled 2022-12-29: qty 1

## 2022-12-29 MED ORDER — PREDNISONE 20 MG PO TABS: 40.0000 mg | ORAL_TABLET | Freq: Every day | ORAL | 0 refills | Status: DC

## 2022-12-29 MED ORDER — METHOCARBAMOL 1000 MG/10ML IJ SOLN
1000.0000 mg | Freq: Once | INTRAVENOUS | Status: AC
  Administered 2022-12-29: 1000 mg via INTRAVENOUS
  Filled 2022-12-29: qty 10

## 2022-12-29 MED ORDER — METHOCARBAMOL 1000 MG/10ML IJ SOLN: 1000.0000 mg | Freq: Once | INTRAMUSCULAR | Status: DC
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
## 2023-03-29 ENCOUNTER — Emergency Department (HOSPITAL_COMMUNITY)
Admission: EM | Admit: 2023-03-29 | Discharge: 2023-03-29 | Disposition: A | Discharge status: Home / Self Care | Payer: 59 | Attending: Emergency Medicine | Admitting: Emergency Medicine

## 2023-03-29 ENCOUNTER — Other Ambulatory Visit: Payer: Self-pay

## 2023-03-29 ENCOUNTER — Emergency Department (HOSPITAL_COMMUNITY): Payer: 59

## 2023-03-29 ENCOUNTER — Encounter (HOSPITAL_COMMUNITY): Payer: Self-pay | Admitting: Emergency Medicine

## 2023-03-29 DIAGNOSIS — R1084 Generalized abdominal pain: Secondary | ICD-10-CM | POA: Insufficient documentation

## 2023-03-29 DIAGNOSIS — Z79899 Other long term (current) drug therapy: Secondary | ICD-10-CM | POA: Diagnosis not present

## 2023-03-29 DIAGNOSIS — I10 Essential (primary) hypertension: Secondary | ICD-10-CM | POA: Diagnosis not present

## 2023-03-29 DIAGNOSIS — R109 Unspecified abdominal pain: Secondary | ICD-10-CM | POA: Diagnosis present

## 2023-03-29 LAB — COMPREHENSIVE METABOLIC PANEL
ALT: 29 U/L (ref 0–44)
AST: 30 U/L (ref 15–41)
Albumin: 4.2 g/dL (ref 3.5–5.0)
Alkaline Phosphatase: 47 U/L (ref 38–126)
Anion gap: 10 (ref 5–15)
BUN: 8 mg/dL (ref 6–20)
CO2: 24 mmol/L (ref 22–32)
Calcium: 9.3 mg/dL (ref 8.9–10.3)
Chloride: 104 mmol/L (ref 98–111)
Creatinine, Ser: 0.93 mg/dL (ref 0.61–1.24)
GFR, Estimated: >60 mL/min (ref >60)
Glucose, Bld: 147 mg/dL — ABNORMAL HIGH (ref 70–99)
Potassium: 3.3 mmol/L — ABNORMAL LOW (ref 3.5–5.1)
Sodium: 138 mmol/L (ref 135–145)
Total Bilirubin: 0.9 mg/dL (ref <1.2)
Total Protein: 7.1 g/dL (ref 6.5–8.1)

## 2023-03-29 LAB — URINALYSIS, ROUTINE W REFLEX MICROSCOPIC
Bilirubin Urine: NEGATIVE
Glucose, UA: NEGATIVE mg/dL
Hgb urine dipstick: NEGATIVE
Ketones, ur: NEGATIVE mg/dL
Leukocytes,Ua: NEGATIVE
Nitrite: NEGATIVE
Protein, ur: NEGATIVE mg/dL
Specific Gravity, Urine: 1.018 (ref 1.005–1.030)
pH: 6 (ref 5.0–8.0)

## 2023-03-29 LAB — CBC
HCT: 43.1 % (ref 39.0–52.0)
Hemoglobin: 14.1 g/dL (ref 13.0–17.0)
MCH: 29.9 pg (ref 26.0–34.0)
MCHC: 32.7 g/dL (ref 30.0–36.0)
MCV: 91.5 fL (ref 80.0–100.0)
Platelets: 355 10*3/uL (ref 150–400)
RBC: 4.71 MIL/uL (ref 4.22–5.81)
RDW: 12.7 % (ref 11.5–15.5)
WBC: 4.6 10*3/uL (ref 4.0–10.5)
nRBC: 0 % (ref 0.0–0.2)

## 2023-03-29 LAB — TYPE AND SCREEN
ABO/RH(D): O POS
Antibody Screen: NEGATIVE

## 2023-03-29 LAB — ABO/RH: ABO/RH(D): O POS

## 2023-03-29 LAB — LIPASE, BLOOD: Lipase: 33 U/L (ref 11–51)

## 2023-03-29 MED ORDER — DICYCLOMINE HCL 10 MG PO CAPS: 10.0000 mg | ORAL_CAPSULE | Freq: Three times a day (TID) | ORAL | 0 refills | Status: DC

## 2023-03-29 MED ORDER — OXYCODONE-ACETAMINOPHEN 5-325 MG PO TABS
2.0000 | ORAL_TABLET | Freq: Once | ORAL | Status: AC
  Administered 2023-03-29: 2 via ORAL
  Filled 2023-03-29: qty 2

## 2023-03-29 MED ORDER — ONDANSETRON HCL 4 MG/2ML IJ SOLN
4.0000 mg | Freq: Once | INTRAMUSCULAR | Status: AC
  Administered 2023-03-29: 4 mg via INTRAVENOUS
  Filled 2023-03-29: qty 2

## 2023-03-29 MED ORDER — HYDROMORPHONE HCL 1 MG/ML IJ SOLN
0.5000 mg | Freq: Once | INTRAMUSCULAR | Status: AC
  Administered 2023-03-29: 0.5 mg via INTRAVENOUS
  Filled 2023-03-29: qty 1

## 2023-03-29 MED ORDER — IOHEXOL 350 MG/ML SOLN
75.0000 mL | Freq: Once | INTRAVENOUS | Status: AC | PRN
  Administered 2023-03-29: 75 mL via INTRAVENOUS

## 2023-03-29 MED ORDER — HYDROMORPHONE HCL 1 MG/ML IJ SOLN
1.0000 mg | Freq: Once | INTRAMUSCULAR | Status: AC
  Administered 2023-03-29: 1 mg via INTRAVENOUS
  Filled 2023-03-29: qty 1

## 2023-03-29 NOTE — ED Provider Notes (Incomplete)
[place] EMERGENCY DEPARTMENT AT [facility] Provider Note   CSN: [id number] Arrival date & time: 03/29/23  6962     History {Add pertinent medical, surgical, social history, OB history to HPI:1} Chief Complaint  Patient presents with   Abdominal Pain   Diarrhea   Rectal Bleeding    [name] is a 52 y.o. male.   Abdominal Pain Associated symptoms: diarrhea and hematochezia   Diarrhea Associated symptoms: abdominal pain   Rectal Bleeding Associated symptoms: abdominal pain        Home Medications Prior to Admission medications   Medication Sig Start Date End Date Taking? Authorizing Provider  dicyclomine (BENTYL) 10 MG capsule Take 1 capsule (10 mg total) by mouth 4 (four) times daily -  before meals and at bedtime. 03/29/23  Yes Cheron Schaumann K, PA-C  amLODipine (NORVASC) 10 MG tablet Take 10 mg by mouth daily. 03/19/22   [provider]  Cyanocobalamin (VITAMIN B-12 SL) Place 5,000 mcg under the tongue once a week.    [provider]  Diclofenac Sodium CR 100 MG 24 hr tablet Take 100 mg by mouth daily. 03/22/22   [provider]  ibuprofen (ADVIL) 400 MG tablet Take 1 tablet (400 mg total) by mouth every 6 (six) hours as needed. Patient taking differently: Take 400 mg by mouth every 6 (six) hours as needed for mild pain or moderate pain. 05/17/22   Georgeanna Lea, MD  losartan (COZAAR) 25 MG tablet Take 1 tablet (25 mg total) by mouth 2 (two) times daily. 05/17/22 05/12/23  Georgeanna Lea, MD  metFORMIN (GLUCOPHAGE-XR) 500 MG 24 hr tablet Take 500 mg by mouth daily. 02/11/22   [provider]  oxyCODONE-acetaminophen (PERCOCET) 10-325 MG tablet Take 1 tablet by mouth every 6 (six) hours as needed for pain. 12/29/22   Gerhard Munch, MD  pantoprazole (PROTONIX) 40 MG tablet Take 40 mg by mouth daily.    [provider]  predniSONE (DELTASONE) 20 MG tablet Take 2 tablets (40 mg total) by mouth daily  with breakfast. For the next four days 12/29/22   Gerhard Munch, MD  Vitamin D, Ergocalciferol, (DRISDOL) 1.25 MG (50000 UNIT) CAPS capsule Take 50,000 Units by mouth every 7 (seven) days.    [provider]      Allergies    Vancomycin    Review of Systems   Review of Systems  Gastrointestinal:  Positive for abdominal pain, diarrhea and hematochezia.    Physical Exam Updated Vital Signs BP (!) 152/103   Pulse (!) 58   Temp 97.9 F (36.6 C) (Oral)   Resp 15   Ht 6\' 1"  (1.854 m)   Wt 104.3 kg   SpO2 99%   BMI 30.34 kg/m  Physical Exam  ED Results / Procedures / Treatments   Labs (all labs ordered are listed, but only abnormal results are displayed) Labs Reviewed  COMPREHENSIVE METABOLIC PANEL - Abnormal; Notable for the following components:      Result Value   Potassium 3.3 (*)    Glucose, Bld 147 (*)    All other components within normal limits  LIPASE, BLOOD  CBC  URINALYSIS, ROUTINE W REFLEX MICROSCOPIC  TYPE AND SCREEN  ABO/RH    EKG None  Radiology CT ABDOMEN PELVIS W CONTRAST  Result Date: 03/29/2023 CLINICAL DATA:  Abdominal pain and diarrhea for 1 week. Diverticulosis. EXAM: CT ABDOMEN AND PELVIS WITH CONTRAST TECHNIQUE: Multidetector CT imaging of the abdomen and pelvis was performed  using the standard protocol following bolus administration of intravenous contrast. RADIATION DOSE REDUCTION: This exam was performed according to the departmental dose-optimization program which includes automated exposure control, adjustment of the mA and/or kV according to patient size and/or use of iterative reconstruction technique. CONTRAST:  75mL OMNIPAQUE IOHEXOL 350 MG/ML SOLN COMPARISON:  12/29/2022 FINDINGS: Lower Chest: No acute findings. Hepatobiliary: No suspicious hepatic masses identified. Stable small cyst in posterior right hepatic lobe. Prior cholecystectomy again noted. Mild diffuse biliary ductal dilatation remains stable. Pancreas: No mass or  inflammatory changes. No evidence of pancreatic ductal dilatation. Spleen: Within normal limits in size and appearance. Adrenals/Urinary Tract: No suspicious masses identified. No evidence of ureteral calculi or hydronephrosis. Stomach/Bowel: No evidence of obstruction, inflammatory process or abnormal fluid collections. Normal appendix visualized. Mild left-sided colonic diverticulosis again noted, without signs of diverticulitis. Vascular/Lymphatic: No pathologically enlarged lymph nodes. No acute vascular findings. Reproductive:  No mass or other significant abnormality. Other: Stable postop changes from previous right inguinal hernia repair. No evidence of recurrent hernia. Musculoskeletal: No suspicious bone lesions identified. Lumbosacral spine fusion hardware again noted, as well as neurostimulator device in the left posterior abdominal wall subcutaneous tissues. IMPRESSION: No acute findings. Mild colonic diverticulosis, without radiographic evidence of diverticulitis. Electronically Signed   By: Danae Orleans M.D.   On: 03/29/2023 13:42    Procedures Procedures  {Document cardiac monitor, telemetry assessment procedure when appropriate:1}  Medications Ordered in ED Medications  HYDROmorphone (DILAUDID) injection 0.5 mg (0.5 mg Intravenous Given 03/29/23 0908)  ondansetron (ZOFRAN) injection 4 mg (4 mg Intravenous Given 03/29/23 0907)  HYDROmorphone (DILAUDID) injection 1 mg (1 mg Intravenous Given 03/29/23 1018)  iohexol (OMNIPAQUE) 350 MG/ML injection 75 mL (75 mLs Intravenous Contrast Given 03/29/23 1203)  HYDROmorphone (DILAUDID) injection 1 mg (1 mg Intravenous Given 03/29/23 1231)  oxyCODONE-acetaminophen (PERCOCET/ROXICET) 5-325 MG per tablet 2 tablet (2 tablets Oral Given 03/29/23 1420)    ED Course/ Medical Decision Making/ A&P   {   Click here for ABCD2, HEART and other calculatorsREFRESH Note before signing :1}                              Medical Decision Making Amount and/or  Complexity of Data Reviewed Labs: ordered. Radiology: ordered.  Risk Prescription drug management.   ***  {Document critical care time when appropriate:1} {Document review of labs and clinical decision tools ie heart score, Chads2Vasc2 etc:1}  {Document your independent review of radiology images, and any outside records:1} {Document your discussion with family members, caretakers, and with consultants:1} {Document social determinants of health affecting pt's care:1} {Document your decision making why or why not admission, treatments were needed:1} Final Clinical Impression(s) / ED Diagnoses Final diagnoses:  Generalized abdominal pain    Rx / DC Orders ED Discharge Orders          Ordered    dicyclomine (BENTYL) 10 MG capsule  3 times daily before meals & bedtime        03/29/23 1416

## 2023-03-29 NOTE — ED Triage Notes (Signed)
Pt. Stated, I started having abdominal pain with diarrhea for a week off and on. This morning I had some diarrhea and there was blood in my stool. A year or 2 I had divertiulitis

## 2023-03-29 NOTE — ED Notes (Signed)
Patient transported to CT 

## 2023-03-29 NOTE — ED Provider Notes (Signed)
[place] EMERGENCY DEPARTMENT AT [facility] Provider Note   CSN: [id number] Arrival date & time: 03/29/23  4098     History  Chief Complaint  Patient presents with   Abdominal Pain   Diarrhea   Rectal Bleeding    [name] is a 52 y.o. male.  Pt complains of severe abdominal pain.  Pt orts he has had diverticulitis in the past and this feels like the same thing.  Patient reports that he noticed some blood with bowel movements today.  Patient reports he has not had any fever or chills.  Patient complains of feeling nauseated.  He has not had any vomiting.  Patient denies any tarry or black stools.  Patient has a past medical history of back pain and hypertension.  The history is provided by the patient. No language interpreter was used.  Abdominal Pain Pain radiates to:  Back Pain severity:  Moderate Onset quality:  Gradual Duration:  1 week Context: not alcohol use, not medication withdrawal and not sick contacts   Worsened by:  Nothing Ineffective treatments:  None tried Associated symptoms: diarrhea and hematochezia   Diarrhea Associated symptoms: abdominal pain   Rectal Bleeding Associated symptoms: abdominal pain        Home Medications Prior to Admission medications   Medication Sig Start Date End Date Taking? Authorizing Provider  dicyclomine (BENTYL) 10 MG capsule Take 1 capsule (10 mg total) by mouth 4 (four) times daily -  before meals and at bedtime. 03/29/23  Yes Cheron Schaumann K, PA-C  amLODipine (NORVASC) 10 MG tablet Take 10 mg by mouth daily. 03/19/22   [provider]  Cyanocobalamin (VITAMIN B-12 SL) Place 5,000 mcg under the tongue once a week.    [provider]  Diclofenac Sodium CR 100 MG 24 hr tablet Take 100 mg by mouth daily. 03/22/22   [provider]  ibuprofen (ADVIL) 400 MG tablet Take 1 tablet (400 mg total) by mouth every 6 (six) hours as needed. Patient taking differently: Take 400 mg by mouth  every 6 (six) hours as needed for mild pain or moderate pain. 05/17/22   Georgeanna Lea, MD  losartan (COZAAR) 25 MG tablet Take 1 tablet (25 mg total) by mouth 2 (two) times daily. 05/17/22 05/12/23  Georgeanna Lea, MD  metFORMIN (GLUCOPHAGE-XR) 500 MG 24 hr tablet Take 500 mg by mouth daily. 02/11/22   [provider]  oxyCODONE-acetaminophen (PERCOCET) 10-325 MG tablet Take 1 tablet by mouth every 6 (six) hours as needed for pain. 12/29/22   Gerhard Munch, MD  pantoprazole (PROTONIX) 40 MG tablet Take 40 mg by mouth daily.    [provider]  predniSONE (DELTASONE) 20 MG tablet Take 2 tablets (40 mg total) by mouth daily with breakfast. For the next four days 12/29/22   Gerhard Munch, MD  Vitamin D, Ergocalciferol, (DRISDOL) 1.25 MG (50000 UNIT) CAPS capsule Take 50,000 Units by mouth every 7 (seven) days.    [provider]      Allergies    Vancomycin    Review of Systems   Review of Systems  Gastrointestinal:  Positive for abdominal pain, diarrhea and hematochezia.  All other systems reviewed and are negative.   Physical Exam Updated Vital Signs BP (!) 152/103   Pulse (!) 58   Temp 97.9 F (36.6 C) (Oral)   Resp 15   Ht 6\' 1"  (1.854 m)   Wt 104.3 kg   SpO2 99%   BMI  30.34 kg/m  Physical Exam Vitals and nursing note reviewed.  Constitutional:      Appearance: He is well-developed.  HENT:     Head: Normocephalic.     Mouth/Throat:     Mouth: Mucous membranes are moist.  Cardiovascular:     Rate and Rhythm: Normal rate.  Pulmonary:     Effort: Pulmonary effort is normal.  Abdominal:     General: Abdomen is flat. Bowel sounds are normal. There is no distension.     Palpations: Abdomen is soft.     Tenderness: There is abdominal tenderness in the left lower quadrant.  Musculoskeletal:        General: Normal range of motion.     Cervical back: Normal range of motion.  Skin:    General: Skin is warm.  Neurological:     General:  No focal deficit present.     Mental Status: He is alert and oriented to person, place, and time.  Psychiatric:        Mood and Affect: Mood normal.     ED Results / Procedures / Treatments   Labs (all labs ordered are listed, but only abnormal results are displayed) Labs Reviewed  COMPREHENSIVE METABOLIC PANEL - Abnormal; Notable for the following components:      Result Value   Potassium 3.3 (*)    Glucose, Bld 147 (*)    All other components within normal limits  LIPASE, BLOOD  CBC  URINALYSIS, ROUTINE W REFLEX MICROSCOPIC  TYPE AND SCREEN  ABO/RH    EKG None  Radiology CT ABDOMEN PELVIS W CONTRAST  Result Date: 03/29/2023 CLINICAL DATA:  Abdominal pain and diarrhea for 1 week. Diverticulosis. EXAM: CT ABDOMEN AND PELVIS WITH CONTRAST TECHNIQUE: Multidetector CT imaging of the abdomen and pelvis was performed using the standard protocol following bolus administration of intravenous contrast. RADIATION DOSE REDUCTION: This exam was performed according to the departmental dose-optimization program which includes automated exposure control, adjustment of the mA and/or kV according to patient size and/or use of iterative reconstruction technique. CONTRAST:  75mL OMNIPAQUE IOHEXOL 350 MG/ML SOLN COMPARISON:  12/29/2022 FINDINGS: Lower Chest: No acute findings. Hepatobiliary: No suspicious hepatic masses identified. Stable small cyst in posterior right hepatic lobe. Prior cholecystectomy again noted. Mild diffuse biliary ductal dilatation remains stable. Pancreas: No mass or inflammatory changes. No evidence of pancreatic ductal dilatation. Spleen: Within normal limits in size and appearance. Adrenals/Urinary Tract: No suspicious masses identified. No evidence of ureteral calculi or hydronephrosis. Stomach/Bowel: No evidence of obstruction, inflammatory process or abnormal fluid collections. Normal appendix visualized. Mild left-sided colonic diverticulosis again noted, without signs of  diverticulitis. Vascular/Lymphatic: No pathologically enlarged lymph nodes. No acute vascular findings. Reproductive:  No mass or other significant abnormality. Other: Stable postop changes from previous right inguinal hernia repair. No evidence of recurrent hernia. Musculoskeletal: No suspicious bone lesions identified. Lumbosacral spine fusion hardware again noted, as well as neurostimulator device in the left posterior abdominal wall subcutaneous tissues. IMPRESSION: No acute findings. Mild colonic diverticulosis, without radiographic evidence of diverticulitis. Electronically Signed   By: Danae Orleans M.D.   On: 03/29/2023 13:42    Procedures Procedures    Medications Ordered in ED Medications  HYDROmorphone (DILAUDID) injection 0.5 mg (0.5 mg Intravenous Given 03/29/23 0908)  ondansetron (ZOFRAN) injection 4 mg (4 mg Intravenous Given 03/29/23 0907)  HYDROmorphone (DILAUDID) injection 1 mg (1 mg Intravenous Given 03/29/23 1018)  iohexol (OMNIPAQUE) 350 MG/ML injection 75 mL (75 mLs Intravenous Contrast Given 03/29/23 1203)  HYDROmorphone (DILAUDID) injection 1 mg (1 mg Intravenous Given 03/29/23 1231)  oxyCODONE-acetaminophen (PERCOCET/ROXICET) 5-325 MG per tablet 2 tablet (2 tablets Oral Given 03/29/23 1420)    ED Course/ Medical Decision Making/ A&P                                 Medical Decision Making Patient complains of pain in his lower abdomen.  Patient reports he has noticed some blood mixed with diarrhea.  Patient is concerned that he is having a flareup of diverticulitis patient has had diverticulitis in the past.  Amount and/or Complexity of Data Reviewed External Data Reviewed: notes.    Details: Primary care notes reviewed Labs: ordered. Decision-making details documented in ED Course.    Details: Labs ordered reviewed and interpreted.  Patient has a normal white blood cell count Radiology: ordered and independent interpretation performed. Decision-making details  documented in ED Course.    Details: CT abdomen and pelvis no acute findings no evidence of acute diverticulitis or abscess  Risk Prescription drug management. Risk Details: Patient presented to the emergency department with severe abdominal pain patient is given Dilaudid IV.  Patient had relief of his pain.  I counseled patient on the results of his CT and his lab work.  I have advised him to follow-up with his GI doctor.  Patient is currently on Percocet at home he is advised to continue his home medications.  I will give him a prescription for Bentyl to help with his current symptoms.  Patient is discharged in stable condition.           Final Clinical Impression(s) / ED Diagnoses Final diagnoses:  Generalized abdominal pain    Rx / DC Orders ED Discharge Orders          Ordered    dicyclomine (BENTYL) 10 MG capsule  3 times daily before meals & bedtime        03/29/23 1416           An After Visit Summary was printed and given to the patient.    Elson Areas, [place] 03/30/23 2725    Jacalyn Lefevre, MD 04/04/23 [phone]

## 2023-03-29 NOTE — Discharge Instructions (Addendum)
Follow up with your Gi doctor for recheck

## 2023-03-29 NOTE — ED Notes (Signed)
Patient back from CT.

## 2023-05-02 ENCOUNTER — Encounter (HOSPITAL_BASED_OUTPATIENT_CLINIC_OR_DEPARTMENT_OTHER): Payer: Self-pay

## 2023-05-02 ENCOUNTER — Ambulatory Visit (HOSPITAL_BASED_OUTPATIENT_CLINIC_OR_DEPARTMENT_OTHER)
Admission: EM | Admit: 2023-05-02 | Discharge: 2023-05-02 | Disposition: A | Discharge status: Home / Self Care | Payer: 59 | Attending: Internal Medicine | Admitting: Internal Medicine

## 2023-05-02 DIAGNOSIS — M25461 Effusion, right knee: Secondary | ICD-10-CM

## 2023-05-02 DIAGNOSIS — W19XXXA Unspecified fall, initial encounter: Secondary | ICD-10-CM | POA: Diagnosis not present

## 2023-05-02 DIAGNOSIS — M25561 Pain in right knee: Secondary | ICD-10-CM

## 2023-05-02 MED ORDER — KETOROLAC TROMETHAMINE 30 MG/ML IJ SOLN
30.0000 mg | Freq: Once | INTRAMUSCULAR | Status: AC
  Administered 2023-05-02: 30 mg via INTRAMUSCULAR

## 2023-05-02 MED ORDER — NAPROXEN 500 MG PO TABS: 500.0000 mg | ORAL_TABLET | Freq: Two times a day (BID) | ORAL | 0 refills | Status: DC

## 2023-05-02 NOTE — ED Provider Notes (Signed)
[name]    CSN: [id number] Arrival date & time: 05/02/23  1526      History   Chief Complaint Chief Complaint  Patient presents with   Knee Pain    HPI [name] is a 52 y.o. male.   Patient presents to urgent care for evaluation of pain and swelling to the right medial knee that started yesterday.  He states that he was walking when the right knee "buckled" causing hyperflexion and fall onto the medial right knee.  He has had pain and swelling to the generalized right knee ever since fall and reports severe pain when attempting to fully extend the right knee.  States he feels "locking, buckling, and grinding sensation" to the inner medial right knee whenever performing weightbearing activity.  Denies previous injury to the right knee, states he broke his left patella many years ago requiring surgery.  He states he feels that "something is not right" to the right knee.  He elevated and iced the right knee last night but has not attempted use of any over-the-counter anti-inflammatory medications prior to arrival.     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 Mild pancolonic diverticulosis.    ESOPHAGOGASTRODUODENOSCOPY  07/08/2008    Mild gastritis. Minimal hiatal hernia.    HERNIA REPAIR Right 11/12/2019   Umbilical and Right inguinal Hernia repair with mesh   KNEE SURGERY Left 1998   Lower back surgery     2013 and 2018   LUMBAR SPINE SURGERY  09/08/2021   Lower Lumbar Discectomy and fusion   POLYPECTOMY     right knee surgery  2005   UPPER GASTROINTESTINAL ENDOSCOPY     VEIN LIGATION AND STRIPPING Bilateral    WISDOM TOOTH EXTRACTION         Home Medications    Prior to Admission medications   Medication Sig Start Date End Date Taking? Authorizing Provider  naproxen (NAPROSYN) 500 MG tablet Take 1 tablet (500 mg total) by mouth 2 (two) times daily. 05/02/23  Yes Carlisle Beers, FNP  amLODipine (NORVASC) 10 MG tablet Take 10 mg by mouth daily. 03/19/22   [provider]  Cyanocobalamin (VITAMIN B-12 SL) Place 5,000 mcg under the tongue once a week.    [provider]  dicyclomine (BENTYL) 10 MG capsule Take 1 capsule (10 mg total) by mouth 4 (four) times daily -  before meals and at bedtime. 03/29/23   Elson Areas, PA-C  ibuprofen (ADVIL) 400 MG tablet Take 1 tablet (400 mg total) by mouth every 6 (six) hours as needed. Patient taking differently: Take 400 mg by mouth every 6 (six) hours as needed for mild  pain or moderate pain. 05/17/22   Georgeanna Lea, MD  losartan (COZAAR) 25 MG tablet Take 1 tablet (25 mg total) by mouth 2 (two) times daily. 05/17/22 05/12/23  Georgeanna Lea, MD  metFORMIN (GLUCOPHAGE-XR) 500 MG 24 hr tablet Take 500 mg by mouth daily. 02/11/22   [provider]  oxyCODONE-acetaminophen (PERCOCET) 10-325 MG tablet Take 1 tablet by mouth every 6 (six) hours as needed for pain. 12/29/22   Gerhard Munch, MD  pantoprazole (PROTONIX) 40 MG tablet Take 40 mg by mouth daily.    [provider]  predniSONE (DELTASONE) 20 MG tablet Take 2 tablets (40 mg total) by mouth daily with breakfast. For the next four days 12/29/22   Gerhard Munch, MD   Vitamin D, Ergocalciferol, (DRISDOL) 1.25 MG (50000 UNIT) CAPS capsule Take 50,000 Units by mouth every 7 (seven) days.    [provider]    Family History Family History  Problem Relation Age of Onset   Stomach cancer Father    Diabetes Father    Kidney disease Father    Hypertension Father    Esophageal cancer Neg Hx    Rectal cancer Neg Hx    Colon cancer Neg Hx     Social History Social History   Tobacco Use   Smoking status: Never   Smokeless tobacco: Never  Vaping Use   Vaping status: Never Used  Substance Use Topics   Alcohol use: Never   Drug use: Never     Allergies   Vancomycin   Review of Systems Review of Systems Per HPI  Physical Exam Triage Vital Signs ED Triage Vitals  Encounter Vitals Group     BP 05/02/23 1540 (!) 130/90     Systolic BP Percentile --      Diastolic BP Percentile --      Pulse Rate 05/02/23 1540 90     Resp 05/02/23 1540 20     Temp 05/02/23 1540 98.7 F (37.1 C)     Temp Source 05/02/23 1540 Oral     SpO2 05/02/23 1540 97 %     Weight 05/02/23 1542 225 lb (102.1 kg)     Height --      Head Circumference --      Peak Flow --      Pain Score 05/02/23 1542 10     Pain Loc --      Pain Education --      Exclude from Growth Chart --    No data found.  Updated Vital Signs BP (!) 130/90 (BP Location: Right Arm)   Pulse 90   Temp 98.7 F (37.1 C) (Oral)   Resp 20   Wt 225 lb (102.1 kg)   SpO2 97%   BMI 29.69 kg/m   Visual Acuity Right Eye Distance:   Left Eye Distance:   Bilateral Distance:    Right Eye Near:   Left Eye Near:    Bilateral Near:     Physical Exam Vitals and nursing note reviewed.  Constitutional:      Appearance: He is not ill-appearing or toxic-appearing.  HENT:     Head: Normocephalic and atraumatic.     Right Ear: Hearing and external ear normal.     Left Ear: Hearing and external ear normal.     Nose: Nose normal.     Mouth/Throat:     Lips: Pink.  Eyes:     General:  Lids are normal. Vision grossly intact. Gaze aligned appropriately.  Extraocular Movements: Extraocular movements intact.     Conjunctiva/sclera: Conjunctivae normal.  Pulmonary:     Effort: Pulmonary effort is normal.  Musculoskeletal:     Cervical back: Neck supple.     Right knee: Swelling and effusion present. No deformity, erythema, ecchymosis, bony tenderness or crepitus. Normal range of motion (Extends right lower extremity at knee joint to 160 degrees, limited secondary to pain). Tenderness present over the medial joint line, MCL, PCL and patellar tendon. No MCL laxity. Normal alignment. Normal pulse (+2 popliteal pulses bilaterally).     Comments: Sensation and strength intact distally to injury.  See images below of knee swelling.  Ambulatory with slightly antalgic gait favoring right lower extremity due to injury.  Skin:    General: Skin is warm and dry.     Capillary Refill: Capillary refill takes less than 2 seconds.     Findings: No rash.  Neurological:     General: No focal deficit present.     Mental Status: He is alert and oriented to person, place, and time. Mental status is at baseline.     Cranial Nerves: No dysarthria or facial asymmetry.  Psychiatric:        Mood and Affect: Mood normal.        Speech: Speech normal.        Behavior: Behavior normal.        Thought Content: Thought content normal.        Judgment: Judgment normal.         UC Treatments / Results  Labs (all labs ordered are listed, but only abnormal results are displayed) Labs Reviewed - No data to display  EKG   Radiology No results found.  Procedures Procedures (including critical care time)  Medications Ordered in UC Medications  ketorolac (TORADOL) 30 MG/ML injection 30 mg (30 mg Intramuscular Given 05/02/23 1707)    Initial Impression / Assessment and Plan / UC Course  I have reviewed the triage vital signs and the nursing notes.  Pertinent labs & imaging results that  were available during my care of the patient were reviewed by me and considered in my medical decision making (see chart for details).   1.  Pain and swelling of right knee, fall Significant pain and swelling of the medial aspect of the right knee concerning for medial meniscus injury. Right knee x-ray ordered outpatient to be completed tomorrow morning. We do not have imaging at this urgent care center and the outpatient imaging center has now closed, therefore this will need to be completed tomorrow morning. Empirically treating as if the imaging will be abnormal given high suspicion for fracture/acute bony abnormality to the right knee. Ace wrap, knee immobilizer, and crutches applied, advised nonweightbearing activity until further notice. I will call patient with results of knee x-ray tomorrow and update treatment plan as indicated.  Regardless, advised to follow-up with [facility] for ongoing evaluation. He is neurovascularly intact distally to injury.  Ketorolac 30 mg IM given for acute pain and swelling in clinic, may start naproxen every 12 hours as needed for pain and swelling tomorrow with food.  RICE advised.  Counseled patient on potential for adverse effects with medications prescribed/recommended today, strict ER and return-to-clinic precautions discussed, patient verbalized understanding.    Final Clinical Impressions(s) / UC Diagnoses   Final diagnoses:  Pain and swelling of right knee  Fall, initial encounter     Discharge Instructions      I am very concerned you may  have fractured the right knee.  Please go to med [facility] and have x-ray performed. Do not check in to the ER. Go to imaging department, get images performed, then go home. You will receive a phone call if the x-ray shows any abnormal results requiring further treatment. If the x-ray results do not change our treatment plan, you will not receive a phone call.  Also see these results on  MyChart.  Med [facility] [address] [place], [place]  I gave you a shot of ketorolac which is a strong antiinflammatory medication. Start taking naproxen every 12 hours as needed for knee pain and swelling starting tomorrow with food.  Wear knee immobilizer and ace wrap all the time unless showering until I call you back.  Please follow-up with [facility] in  [place]AP.  [address] [place], [place] [address] [phone]  I will be in touch with x-ray results.  If you develop any new or worsening symptoms or if your symptoms do not start to improve, please return here or follow-up with your primary care provider. If your symptoms are severe, please go to the emergency room.     ED Prescriptions     Medication Sig Dispense Auth. Provider   naproxen (NAPROSYN) 500 MG tablet Take 1 tablet (500 mg total) by mouth 2 (two) times daily. 30 tablet Carlisle Beers, FNP      PDMP not reviewed this encounter.   Carlisle Beers, [place] 05/02/23 1725

## 2023-05-02 NOTE — Discharge Instructions (Addendum)
I am very concerned you may have fractured the right knee.  Please go to med [facility] and have x-ray performed. Do not check in to the ER. Go to imaging department, get images performed, then go home. You will receive a phone call if the x-ray shows any abnormal results requiring further treatment. If the x-ray results do not change our treatment plan, you will not receive a phone call.  Also see these results on MyChart.  Med [facility] [place] [address] [place], [place]  I gave you a shot of ketorolac which is a strong antiinflammatory medication. Start taking naproxen every 12 hours as needed for knee pain and swelling starting tomorrow with food.  Wear knee immobilizer and ace wrap all the time unless showering until I call you back.  Please follow-up with [facility] in [place] ASAP.  [address] [place], [place] [address] [phone]  I will be in touch with x-ray results.  If you develop any new or worsening symptoms or if your symptoms do not start to improve, please return here or follow-up with your primary care provider. If your symptoms are severe, please go to the emergency room.

## 2023-05-02 NOTE — ED Triage Notes (Signed)
States while walking out of the house to go to church, right knee "buckled and bent backwards". Reporting pain to knee since. Able to ambulate but with pain. States ice to knee last night. No tylenol or ibuprofen today.

## 2023-05-03 ENCOUNTER — Telehealth (HOSPITAL_BASED_OUTPATIENT_CLINIC_OR_DEPARTMENT_OTHER): Payer: Self-pay

## 2023-05-03 ENCOUNTER — Ambulatory Visit (HOSPITAL_BASED_OUTPATIENT_CLINIC_OR_DEPARTMENT_OTHER)
Admission: RE | Admit: 2023-05-03 | Discharge: 2023-05-03 | Disposition: A | Discharge status: Home / Self Care | Payer: 59 | Source: Ambulatory Visit | Attending: Internal Medicine | Admitting: Internal Medicine

## 2023-05-03 DIAGNOSIS — M25561 Pain in right knee: Secondary | ICD-10-CM | POA: Insufficient documentation

## 2023-05-03 DIAGNOSIS — M25461 Effusion, right knee: Secondary | ICD-10-CM | POA: Insufficient documentation

## 2023-05-03 DIAGNOSIS — M1711 Unilateral primary osteoarthritis, right knee: Secondary | ICD-10-CM | POA: Diagnosis not present

## 2023-05-10 DIAGNOSIS — I4510 Unspecified right bundle-branch block: Secondary | ICD-10-CM

## 2023-05-11 DIAGNOSIS — I4510 Unspecified right bundle-branch block: Secondary | ICD-10-CM | POA: Diagnosis not present

## 2023-05-12 ENCOUNTER — Ambulatory Visit (HOSPITAL_BASED_OUTPATIENT_CLINIC_OR_DEPARTMENT_OTHER)
Admission: EM | Admit: 2023-05-12 | Discharge: 2023-05-12 | Disposition: A | Discharge status: Home / Self Care | Payer: 59 | Attending: Family Medicine | Admitting: Family Medicine

## 2023-05-12 ENCOUNTER — Encounter (HOSPITAL_BASED_OUTPATIENT_CLINIC_OR_DEPARTMENT_OTHER): Payer: Self-pay | Admitting: Emergency Medicine

## 2023-05-12 DIAGNOSIS — T148XXA Other injury of unspecified body region, initial encounter: Secondary | ICD-10-CM

## 2023-05-12 MED ORDER — MUPIROCIN 2 % EX OINT: 1.0000 | TOPICAL_OINTMENT | Freq: Two times a day (BID) | CUTANEOUS | 0 refills | Status: DC

## 2023-05-12 NOTE — ED Triage Notes (Signed)
 Pt was in the ER on Monday thought he had a stroke was kept overnight. Pt was on bed rest and they place a condom catheter, he noticed this morning his penis was sore and he seen a sore on the tip of his penis.

## 2023-05-12 NOTE — Discharge Instructions (Signed)
Put mupirocin ointment on the sore areas twice daily until improved

## 2023-05-12 NOTE — ED Provider Notes (Signed)
 [name]    CSN: [id number] Arrival date & time: 05/12/23  1605      History   Chief Complaint Chief Complaint  Patient presents with   Sore    HPI [name] is a 53 y.o. male.   HPI Here for irritation and a sore on the tip of his penis.  He was admitted overnight on the evening of January 6 for evaluation for possible stroke.  He states that it was decided that he had not had a stroke.  He did have a condom catheter on while he was in the hospital.  He was discharged yesterday and did not note any problems until this morning when he noticed it was irritated at the tip of his penis when he was taking his showers.  He has noted an ulceration or blister there.  No urinary frequency and no dysuria.  No abdominal pain.   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 Mild pancolonic diverticulosis.    ESOPHAGOGASTRODUODENOSCOPY  07/08/2008   Mild gastritis. Minimal hiatal hernia.    HERNIA REPAIR Right 11/12/2019   Umbilical and Right inguinal Hernia repair with mesh   KNEE SURGERY Left 1998   Lower back surgery     2013 and 2018   LUMBAR SPINE SURGERY  09/08/2021   Lower Lumbar Discectomy and fusion    POLYPECTOMY     right knee surgery  2005   UPPER GASTROINTESTINAL ENDOSCOPY     VEIN LIGATION AND STRIPPING Bilateral    WISDOM TOOTH EXTRACTION         Home Medications    Prior to Admission medications   Medication Sig Start Date End Date Taking? Authorizing Provider  mupirocin  ointment (BACTROBAN ) 2 % Apply 1 Application topically 2 (two) times daily. To affected area till better 05/12/23  Yes Lebaron Bautch, Sharlet POUR, MD  amLODipine  (NORVASC ) 10 MG tablet Take 10 mg by mouth daily. 03/19/22   [provider]  Cyanocobalamin  (VITAMIN B-12 SL) Place 5,000 mcg under the tongue once a week.    [provider]  dicyclomine  (BENTYL ) 10 MG capsule Take 1 capsule (10 mg total) by mouth 4 (four) times daily -  before meals and at bedtime. 03/29/23   Sofia, Leslie K, PA-C  ibuprofen  (ADVIL ) 400 MG tablet Take 1 tablet (400 mg total) by mouth every 6 (six) hours as needed. Patient taking differently: Take 400 mg by mouth every 6 (six) hours as needed for mild pain or moderate pain. 05/17/22   Krasowski, Robert J, MD  losartan  (COZAAR ) 25 MG tablet Take 1 tablet (25 mg total) by mouth 2 (two) times daily.  05/17/22 05/12/23  Krasowski, Robert J, MD  metFORMIN  (GLUCOPHAGE -XR) 500 MG 24 hr tablet Take 500 mg by mouth daily. 02/11/22   [provider]  naproxen  (NAPROSYN ) 500 MG tablet Take 1 tablet (500 mg total) by mouth 2 (two) times daily. 05/02/23   Enedelia Dorna HERO, FNP  pantoprazole  (PROTONIX ) 40 MG tablet Take 40 mg by mouth daily.    [provider]  Vitamin D , Ergocalciferol , (DRISDOL) 1.25 MG (50000 UNIT) CAPS capsule Take 50,000 Units by mouth every 7 (seven) days.    [provider]    Family History Family History  Problem Relation Age of Onset   Stomach cancer Father    Diabetes Father    Kidney disease Father    Hypertension Father    Esophageal cancer Neg Hx    Rectal cancer Neg Hx    Colon cancer Neg Hx     Social History Social  History   Tobacco Use   Smoking status: Never   Smokeless tobacco: Never  Vaping Use   Vaping status: Never Used  Substance Use Topics   Alcohol use: Never   Drug use: Never     Allergies   Vancomycin    Review of Systems Review of Systems   Physical Exam Triage Vital Signs ED Triage Vitals  Encounter Vitals Group     BP 05/12/23 1630 134/87     Systolic BP Percentile --      Diastolic BP Percentile --      Pulse Rate 05/12/23 1630 72     Resp 05/12/23 1630 20     Temp 05/12/23 1630 98.2 F (36.8 C)     Temp Source 05/12/23 1630 Oral     SpO2 05/12/23 1630 98 %     Weight --      Height --      Head Circumference --      Peak Flow --      Pain Score 05/12/23 1629 8     Pain Loc --      Pain Education --      Exclude from Growth Chart --    No data found.  Updated Vital Signs BP 134/87 (BP Location: Right Arm)   Pulse 72   Temp 98.2 F (36.8 C) (Oral)   Resp 20   SpO2 98%   Visual Acuity Right Eye Distance:   Left Eye Distance:   Bilateral Distance:    Right Eye Near:   Left Eye Near:    Bilateral Near:     Physical Exam Vitals reviewed.  Constitutional:      General: He is not in acute distress.    Appearance: He is not ill-appearing, toxic-appearing or diaphoretic.  Genitourinary:    Comments: Is an intact blister on the dorsal surface in the head of his penis.  It is about 1.5 x 0.5 cm.  There is no surrounding erythema or swelling.  There is no induration.  Chaperone is present during the exam. Skin:    Coloration: Skin is not jaundiced or pale.  Neurological:     Mental Status: He is alert and oriented to person, place, and time.     Deep Tendon Reflexes: Abnormal reflex: .urg.  Psychiatric:        Behavior: Behavior normal.      UC Treatments / Results  Labs (all labs ordered are listed, but only abnormal results are displayed) Labs Reviewed - No data to display  EKG   Radiology No results found.  Procedures Procedures  (including critical Sandoval time)  Medications Ordered in UC Medications - No data to display  Initial Impression / Assessment and Plan / UC Course  I have reviewed the triage vital signs and the nursing notes.  Pertinent labs & imaging results that were available during my Sandoval of the patient were reviewed by me and considered in my medical decision making (see chart for details).   I do think this is most likely a blister from friction and pressure.  It does not appear herpetiform.  I have sent in some mupirocin  to use on it, especially after the blister breaks open, and to prevent infection and to relieve friction on it and it is open. Final Clinical Impressions(s) / UC Diagnoses   Final diagnoses:  Blister of skin     Discharge Instructions      Put mupirocin  ointment on the sore areas twice daily until improved       ED Prescriptions     Medication Sig Dispense Auth. Provider   mupirocin  ointment (BACTROBAN ) 2 % Apply 1 Application topically 2 (two) times daily. To affected area till better 22 g Vonna Sharlet POUR, MD      PDMP not reviewed this encounter.   Vonna Sharlet POUR, MD 05/12/23 [phone]

## 2023-05-19 ENCOUNTER — Other Ambulatory Visit (HOSPITAL_COMMUNITY): Payer: Self-pay | Admitting: Family Medicine

## 2023-05-19 DIAGNOSIS — M25561 Pain in right knee: Secondary | ICD-10-CM

## 2023-05-30 ENCOUNTER — Ambulatory Visit (HOSPITAL_COMMUNITY)
Admission: RE | Admit: 2023-05-30 | Discharge: 2023-05-30 | Disposition: A | Discharge status: Home / Self Care | Payer: 59 | Source: Ambulatory Visit | Attending: Family Medicine | Admitting: Family Medicine

## 2023-05-30 DIAGNOSIS — M25561 Pain in right knee: Secondary | ICD-10-CM | POA: Diagnosis present

## 2023-07-04 ENCOUNTER — Encounter (HOSPITAL_BASED_OUTPATIENT_CLINIC_OR_DEPARTMENT_OTHER): Payer: Self-pay | Admitting: Emergency Medicine

## 2023-07-04 ENCOUNTER — Ambulatory Visit (HOSPITAL_BASED_OUTPATIENT_CLINIC_OR_DEPARTMENT_OTHER)
Admission: EM | Admit: 2023-07-04 | Discharge: 2023-07-04 | Disposition: A | Discharge status: Home / Self Care | Attending: Family Medicine | Admitting: Family Medicine

## 2023-07-04 DIAGNOSIS — R051 Acute cough: Secondary | ICD-10-CM

## 2023-07-04 DIAGNOSIS — R509 Fever, unspecified: Secondary | ICD-10-CM

## 2023-07-04 DIAGNOSIS — J069 Acute upper respiratory infection, unspecified: Secondary | ICD-10-CM | POA: Diagnosis not present

## 2023-07-04 LAB — POC COVID19/FLU A&B COMBO
Covid Antigen, POC: NEGATIVE
Influenza A Antigen, POC: NEGATIVE
Influenza B Antigen, POC: NEGATIVE

## 2023-07-04 NOTE — ED Provider Notes (Signed)
 [name]    CSN: [id number] Arrival date & time: 07/04/23  1456      History   Chief Complaint No chief complaint on file.   HPI [name] is a 53 y.o. male.   Pt wife tested positive for Covid on 07/03/23.  He has been taking care of his wife and he woke up during the night (early am of 07/04/23) and had a cough, fever and body aches.       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 Mild pancolonic diverticulosis.    ESOPHAGOGASTRODUODENOSCOPY  07/08/2008   Mild gastritis. Minimal hiatal hernia.    HERNIA REPAIR Right 11/12/2019   Umbilical and Right inguinal Hernia repair with mesh   KNEE SURGERY Left 1998   Lower back surgery     2013 and 2018   LUMBAR SPINE SURGERY  09/08/2021   Lower Lumbar Discectomy and fusion   POLYPECTOMY     right knee surgery  2005   UPPER GASTROINTESTINAL ENDOSCOPY     VEIN LIGATION AND STRIPPING Bilateral    WISDOM TOOTH EXTRACTION         Home Medications    Prior to Admission medications   Medication Sig Start Date End Date Taking? Authorizing Provider  amLODipine (NORVASC) 10 MG tablet Take 10 mg by mouth daily. 03/19/22  Yes [provider]  metFORMIN (GLUCOPHAGE-XR) 500 MG 24 hr  tablet Take 500 mg by mouth daily. 02/11/22  Yes [provider]  Cyanocobalamin (VITAMIN B-12 SL) Place 5,000 mcg under the tongue once a week.    [provider]  dicyclomine (BENTYL) 10 MG capsule Take 1 capsule (10 mg total) by mouth 4 (four) times daily -  before meals and at bedtime. 03/29/23   Elson Areas, PA-C  ibuprofen (ADVIL) 400 MG tablet Take 1 tablet (400 mg total) by mouth every 6 (six) hours as needed. Patient taking differently: Take 400 mg by mouth every 6 (six) hours as needed for mild pain or moderate pain. 05/17/22   Georgeanna Lea, MD  losartan (COZAAR) 25 MG tablet Take 1 tablet (25 mg total) by mouth 2 (two) times daily. 05/17/22 05/12/23  Georgeanna Lea, MD  mupirocin ointment (BACTROBAN) 2 % Apply 1 Application topically 2 (two) times daily. To affected area till better 05/12/23   Zenia Resides, MD  naproxen (NAPROSYN) 500 MG tablet Take 1 tablet (500 mg total) by mouth 2 (two) times daily. 05/02/23   Carlisle Beers, FNP  pantoprazole (PROTONIX) 40 MG tablet Take 40 mg by mouth daily.    [provider]  Vitamin D, Ergocalciferol, (DRISDOL) 1.25 MG (50000 UNIT) CAPS capsule Take  50,000 Units by mouth every 7 (seven) days.    [provider]    Family History Family History  Problem Relation Age of Onset   Stomach cancer Father    Diabetes Father    Kidney disease Father    Hypertension Father    Esophageal cancer Neg Hx    Rectal cancer Neg Hx    Colon cancer Neg Hx     Social History Social History   Tobacco Use   Smoking status: Never   Smokeless tobacco: Never  Vaping Use   Vaping status: Never Used  Substance Use Topics   Alcohol use: Never   Drug use: Never     Allergies   Vancomycin   Review of Systems Review of Systems  Constitutional:  Positive for fever. Negative for chills.  HENT:  Negative for ear pain and sore throat.   Eyes:  Negative for pain and visual disturbance.   Respiratory:  Positive for cough.   Cardiovascular:  Negative for chest pain and palpitations.  Gastrointestinal:  Negative for abdominal pain, constipation, diarrhea, nausea and vomiting.  Genitourinary:  Negative for dysuria and hematuria.  Musculoskeletal:  Positive for arthralgias. Negative for back pain.  Skin:  Negative for color change and rash.  Neurological:  Negative for seizures and syncope.  All other systems reviewed and are negative.    Physical Exam Triage Vital Signs ED Triage Vitals [07/04/23 1544]  Encounter Vitals Group     BP (!) 147/85     Systolic BP Percentile      Diastolic BP Percentile      Pulse Rate 78     Resp 18     Temp 98.9 F (37.2 C)     Temp Source Oral     SpO2 98 %     Weight      Height      Head Circumference      Peak Flow      Pain Score 0     Pain Loc      Pain Education      Exclude from Growth Chart    No data found.  Updated Vital Signs BP (!) 147/85 (BP Location: Right Arm)   Pulse 78   Temp 98.9 F (37.2 C) (Oral)   Resp 18   SpO2 98%   Visual Acuity Right Eye Distance:   Left Eye Distance:   Bilateral Distance:    Right Eye Near:   Left Eye Near:    Bilateral Near:     Physical Exam Vitals and nursing note reviewed.  Constitutional:      General: He is not in acute distress.    Appearance: He is well-developed. He is not ill-appearing or toxic-appearing.  HENT:     Head: Normocephalic and atraumatic.     Right Ear: Hearing, tympanic membrane, ear canal and external ear normal.     Left Ear: Hearing, tympanic membrane, ear canal and external ear normal.     Nose: Congestion and rhinorrhea present. Rhinorrhea is clear.     Right Sinus: No maxillary sinus tenderness or frontal sinus tenderness.     Left Sinus: No maxillary sinus tenderness or frontal sinus tenderness.     Mouth/Throat:     Lips: Pink.     Mouth: Mucous membranes are moist.     Pharynx: Uvula midline. No oropharyngeal exudate or posterior  oropharyngeal erythema.     Tonsils: No tonsillar exudate.  Eyes:     Conjunctiva/sclera: Conjunctivae  normal.     Pupils: Pupils are equal, round, and reactive to light.  Cardiovascular:     Rate and Rhythm: Normal rate and regular rhythm.     Heart sounds: S1 normal and S2 normal. No murmur heard. Pulmonary:     Effort: Pulmonary effort is normal. No respiratory distress.     Breath sounds: Normal breath sounds. No decreased breath sounds, wheezing, rhonchi or rales.  Abdominal:     General: Bowel sounds are normal.     Palpations: Abdomen is soft.     Tenderness: There is no abdominal tenderness.  Musculoskeletal:        General: No swelling.     Cervical back: Neck supple.  Lymphadenopathy:     Head:     Right side of head: No submental, submandibular, tonsillar, preauricular or posterior auricular adenopathy.     Left side of head: No submental, submandibular, tonsillar, preauricular or posterior auricular adenopathy.     Cervical: No cervical adenopathy.     Right cervical: No superficial cervical adenopathy.    Left cervical: No superficial cervical adenopathy.  Skin:    General: Skin is warm and dry.     Capillary Refill: Capillary refill takes less than 2 seconds.     Findings: No rash.  Neurological:     Mental Status: He is alert and oriented to person, place, and time.  Psychiatric:        Mood and Affect: Mood normal.      UC Treatments / Results  Labs (all labs ordered are listed, but only abnormal results are displayed) Labs Reviewed  POC COVID19/FLU A&B COMBO - Normal    EKG   Radiology No results found.  Procedures Procedures (including critical care time)  Medications Ordered in UC Medications - No data to display  Initial Impression / Assessment and Plan / UC Course  I have reviewed the triage vital signs and the nursing notes.  Pertinent labs & imaging results that were available during my care of the patient were reviewed by me and  considered in my medical decision making (see chart for details).     Negative for flu and COVID.  Encouraged to get home flu COVID test and check his test tomorrow.  If he is positive for flu or COVID tomorrow if I could treat him if he let me know he was positive.  He could be negative tomorrow and positive on Wednesday but on Wednesday he would need to come back and see the nurse practitioner working the next day for reevaluation.  Get plenty of fluids and rest.  Follow-up if symptoms do not improve, worsen or new symptoms occur.  Declined a work excuse. Final Clinical Impressions(s) / UC Diagnoses   Final diagnoses:  Fever, unspecified  Acute cough  Viral URI with cough     Discharge Instructions      Negative for flu and COVID.  Advised that he may just be too early to get a positive test result.  Encouraged to get home flu and COVID test.  If he test positive tomorrow and contact me I will get him on medication for either COVID or flu.  He could still be positive on Wednesday but would probably need to be seen as that provider would not have seen him for all of this.  Get plenty of fluids and rest.  Follow-up if symptoms do not improve, worsen or new symptoms occur.     ED Prescriptions   None  PDMP not reviewed this encounter.   Prescilla Sours, FNP 07/04/23 [phone]

## 2023-07-04 NOTE — ED Triage Notes (Signed)
 Pt wife tested positive for Covid yesterday, pt c/o coughing, fever, feeling bad started last night.

## 2023-07-04 NOTE — Discharge Instructions (Signed)
 Negative for flu and COVID.  Advised that he may just be too early to get a positive test result.  Encouraged to get home flu and COVID test.  If he test positive tomorrow and contact me I will get him on medication for either COVID or flu.  He could still be positive on Wednesday but would probably need to be seen as that provider would not have seen him for all of this.  Get plenty of fluids and rest.  Follow-up if symptoms do not improve, worsen or new symptoms occur.

## 2023-07-05 ENCOUNTER — Encounter (HOSPITAL_BASED_OUTPATIENT_CLINIC_OR_DEPARTMENT_OTHER): Payer: Self-pay | Admitting: Family Medicine

## 2023-07-05 ENCOUNTER — Other Ambulatory Visit (HOSPITAL_BASED_OUTPATIENT_CLINIC_OR_DEPARTMENT_OTHER): Payer: Self-pay

## 2023-07-05 ENCOUNTER — Telehealth (HOSPITAL_BASED_OUTPATIENT_CLINIC_OR_DEPARTMENT_OTHER): Payer: Self-pay | Admitting: Family Medicine

## 2023-07-05 DIAGNOSIS — U071 COVID-19: Secondary | ICD-10-CM

## 2023-07-05 MED ORDER — PAXLOVID (300/100) 20 X 150 MG & 10 X 100MG PO TBPK
3.0000 | ORAL_TABLET | Freq: Two times a day (BID) | ORAL | 0 refills | Status: AC
Start: 2023-07-05 — End: 2023-07-10
  Filled 2023-07-05: qty 30, 5d supply, fill #0

## 2023-07-05 NOTE — Telephone Encounter (Signed)
 Patient's wife was positive for Covid-19 yesterday and he was negative.  He only started symptoms yesterday (07/04/23).  He called and let me know that he was positive for Covid-19 on a home test today.  I will start him on Paxlovid, as discussed yesterday.  Patient updated via VM message on his phone and his wife's phone.

## 2023-07-11 ENCOUNTER — Telehealth (HOSPITAL_BASED_OUTPATIENT_CLINIC_OR_DEPARTMENT_OTHER): Payer: Self-pay

## 2023-07-18 ENCOUNTER — Encounter (HOSPITAL_BASED_OUTPATIENT_CLINIC_OR_DEPARTMENT_OTHER): Payer: Self-pay | Admitting: Emergency Medicine

## 2023-07-18 ENCOUNTER — Ambulatory Visit (HOSPITAL_BASED_OUTPATIENT_CLINIC_OR_DEPARTMENT_OTHER)
Admission: EM | Admit: 2023-07-18 | Discharge: 2023-07-18 | Disposition: A | Discharge status: Home / Self Care | Attending: Family Medicine | Admitting: Family Medicine

## 2023-07-18 ENCOUNTER — Ambulatory Visit (HOSPITAL_BASED_OUTPATIENT_CLINIC_OR_DEPARTMENT_OTHER)
Admit: 2023-07-18 | Discharge: 2023-07-18 | Disposition: A | Discharge status: Home / Self Care | Attending: Family Medicine | Admitting: Radiology

## 2023-07-18 ENCOUNTER — Other Ambulatory Visit (HOSPITAL_BASED_OUTPATIENT_CLINIC_OR_DEPARTMENT_OTHER): Payer: Self-pay

## 2023-07-18 DIAGNOSIS — J189 Pneumonia, unspecified organism: Secondary | ICD-10-CM

## 2023-07-18 DIAGNOSIS — R0602 Shortness of breath: Secondary | ICD-10-CM

## 2023-07-18 DIAGNOSIS — J101 Influenza due to other identified influenza virus with other respiratory manifestations: Secondary | ICD-10-CM

## 2023-07-18 DIAGNOSIS — R051 Acute cough: Secondary | ICD-10-CM

## 2023-07-18 DIAGNOSIS — R509 Fever, unspecified: Secondary | ICD-10-CM | POA: Diagnosis not present

## 2023-07-18 DIAGNOSIS — R059 Cough, unspecified: Secondary | ICD-10-CM | POA: Diagnosis not present

## 2023-07-18 LAB — POCT INFLUENZA A/B
Influenza A, POC: POSITIVE — AB
Influenza B, POC: NEGATIVE

## 2023-07-18 MED ORDER — ACETAMINOPHEN 325 MG PO TABS
650.0000 mg | ORAL_TABLET | Freq: Once | ORAL | Status: AC
  Administered 2023-07-18: 650 mg via ORAL

## 2023-07-18 MED ORDER — ACETAMINOPHEN 325 MG PO TABS
650.0000 mg | ORAL_TABLET | Freq: Once | ORAL | Status: DC
Start: 2023-07-18 — End: 2023-07-18

## 2023-07-18 MED ORDER — PROMETHAZINE-DM 6.25-15 MG/5ML PO SYRP
5.0000 mL | ORAL_SOLUTION | Freq: Four times a day (QID) | ORAL | 0 refills | Status: DC | PRN
  Filled 2023-07-18: qty 118, 6d supply, fill #0

## 2023-07-18 MED ORDER — ALBUTEROL SULFATE HFA 108 (90 BASE) MCG/ACT IN AERS
2.0000 | INHALATION_SPRAY | RESPIRATORY_TRACT | 0 refills | Status: DC | PRN
  Filled 2023-07-18: qty 6.7, 25d supply, fill #0

## 2023-07-18 MED ORDER — CEFTRIAXONE SODIUM 1 G IJ SOLR
1.0000 g | Freq: Once | INTRAMUSCULAR | Status: AC
  Administered 2023-07-18: 1 g via INTRAMUSCULAR

## 2023-07-18 MED ORDER — OSELTAMIVIR PHOSPHATE 75 MG PO CAPS
75.0000 mg | ORAL_CAPSULE | Freq: Two times a day (BID) | ORAL | 0 refills | Status: DC
  Filled 2023-07-18: qty 10, 5d supply, fill #0

## 2023-07-18 MED ORDER — CEFDINIR 300 MG PO CAPS
300.0000 mg | ORAL_CAPSULE | Freq: Two times a day (BID) | ORAL | 0 refills | Status: AC
  Filled 2023-07-18: qty 14, 7d supply, fill #0

## 2023-07-18 NOTE — Discharge Instructions (Addendum)
 Chest x-ray is a little hazy but no consolidation.  He is positive for influenza type A.  He had COVID on 07/04/2023.  I believe he has early pneumonia and I am going to treat him for it.  Will update him if the radiology review changes the plan of care.  Rocephin 1 g IM now.  Cefdinir, 300 mg twice daily for 7 days.  Promethazine DM, 5 mL every 6 hours as needed for cough.  Get plenty of fluids and rest.  Provided a prescription for Tamiflu but I discussed with him that I do not think he needs to take the Tamiflu.  I believe he will get through the flu and he mostly needs to take the medicine for pneumonia.  Follow-up if symptoms do not improve, worsen or new symptoms occur and needs a 2 or 3-week recheck with possible repeat chest x-ray here or at the next primary care visit.

## 2023-07-18 NOTE — ED Provider Notes (Signed)
 [name]    CSN: [id number] Arrival date & time: 07/18/23  1437      History   Chief Complaint No chief complaint on file.   HPI [name] is a 53 y.o. male.   The patient was seen on 07/04/2023 with cough, fever, body aches.  His wife tested positive for COVID on 07/03/2023.  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  He was negative for flu and COVID on 07/04/2023 but on 07/05/2023 he had a home COVID flu test and he was positive for COVID.  He was treated with Paxlovid and felt better for a while but in the last few days he has felt much worse.  He has a cough, congestion, body aches, fever, weakness since 07/16/2023.     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 Mild pancolonic diverticulosis.    ESOPHAGOGASTRODUODENOSCOPY  07/08/2008   Mild gastritis. Minimal hiatal hernia.    HERNIA REPAIR Right 11/12/2019   Umbilical and Right inguinal Hernia repair with mesh   KNEE SURGERY Left 1998   Lower back surgery     2013 and 2018   LUMBAR SPINE SURGERY  09/08/2021   Lower Lumbar Discectomy and fusion   POLYPECTOMY     right knee surgery  2005   UPPER GASTROINTESTINAL ENDOSCOPY     VEIN LIGATION AND STRIPPING Bilateral    WISDOM TOOTH EXTRACTION          Home Medications    Prior to Admission medications   Medication Sig Start Date End Date Taking? Authorizing Provider  albuterol (VENTOLIN HFA) 108 (90 Base) MCG/ACT inhaler Inhale 2 puffs into the lungs every 4 (four) hours as needed for wheezing or shortness of breath. 07/18/23  Yes Prescilla Sours, FNP  amLODipine (NORVASC) 10 MG tablet Take 10 mg by mouth daily. 03/19/22  Yes [provider]  cefdinir (OMNICEF) 300 MG capsule Take 1 capsule (300 mg total) by mouth 2 (two) times daily after a meal for 7 days. 07/18/23 07/25/23 Yes Prescilla Sours, FNP  metFORMIN (GLUCOPHAGE-XR) 500 MG 24 hr tablet Take 500 mg by mouth daily. 02/11/22  Yes [provider]  oseltamivir (TAMIFLU) 75 MG capsule Take 1 capsule (75 mg total) by mouth every 12 (twelve) hours. 07/18/23  Yes Prescilla Sours, FNP  promethazine-dextromethorphan (PROMETHAZINE-DM) 6.25-15 MG/5ML syrup Take 5 mLs by mouth 4 (four) times daily as needed for cough. Do not use and drive - May make drowsy. 07/18/23  Yes Prescilla Sours, FNP  Cyanocobalamin (VITAMIN B-12 SL) Place 5,000 mcg under the tongue once a week.    [provider]  dicyclomine (BENTYL) 10 MG capsule Take 1 capsule (10 mg total) by mouth 4 (  four) times daily -  before meals and at bedtime. 03/29/23   Elson Areas, PA-C  ibuprofen (ADVIL) 400 MG tablet Take 1 tablet (400 mg total) by mouth every 6 (six) hours as needed. Patient taking differently: Take 400 mg by mouth every 6 (six) hours as needed for mild pain or moderate pain. 05/17/22   Georgeanna Lea, MD  losartan (COZAAR) 25 MG tablet Take 1 tablet (25 mg total) by mouth 2 (two) times daily. 05/17/22 05/12/23  Georgeanna Lea, MD  mupirocin ointment (BACTROBAN) 2 % Apply 1 Application topically 2 (two) times daily. To affected area till better 05/12/23   Zenia Resides, MD  naproxen (NAPROSYN) 500 MG tablet Take 1 tablet (500 mg total) by mouth 2 (two) times daily. 05/02/23   Carlisle Beers, FNP  pantoprazole (PROTONIX) 40 MG tablet Take 40 mg by mouth daily.    [provider]  Vitamin D, Ergocalciferol, (DRISDOL) 1.25 MG (50000 UNIT) CAPS capsule Take 50,000 Units by mouth every 7 (seven) days.    [provider]    Family History Family History  Problem Relation Age of Onset   Stomach cancer Father    Diabetes Father    Kidney disease Father    Hypertension Father    Esophageal cancer Neg Hx    Rectal cancer Neg Hx    Colon cancer Neg Hx     Social History Social History   Tobacco Use   Smoking status: Never   Smokeless tobacco: Never  Vaping Use   Vaping status: Never Used  Substance Use Topics   Alcohol use: Never   Drug use: Never     Allergies   Vancomycin   Review of Systems Review of Systems  Constitutional:  Positive for fatigue and fever. Negative for chills.  HENT:  Positive for congestion, postnasal drip, rhinorrhea, sinus pressure and sinus pain. Negative for ear pain and sore throat.   Eyes:  Negative for pain and visual disturbance.  Respiratory:  Positive for cough, chest tightness and shortness of breath.   Cardiovascular:  Negative for chest pain and palpitations.  Gastrointestinal:  Negative for abdominal pain, constipation, diarrhea, nausea and vomiting.  Genitourinary:  Negative for dysuria and hematuria.  Musculoskeletal:  Positive for arthralgias. Negative for back pain.  Skin:  Negative for color change and rash.  Neurological:  Positive for weakness. Negative for seizures and syncope.  All other systems reviewed and are negative.    Physical Exam Triage Vital Signs ED Triage Vitals  Encounter Vitals Group     BP 07/18/23 1443 (!) 150/99     Systolic BP Percentile --      Diastolic BP Percentile --      Pulse Rate 07/18/23 1443 99     Resp 07/18/23 1443 20     Temp 07/18/23 1443 (!) 101.4 F (38.6 C)     Temp Source 07/18/23 1443 Oral     SpO2 07/18/23 1443 96 %     Weight --       Height --      Head Circumference --      Peak Flow --      Pain Score 07/18/23 1445 0     Pain Loc --      Pain Education --      Exclude from Growth Chart --    No data found.  Updated Vital Signs BP (!) 150/99 (BP Location: Right Arm)   Pulse 99  Temp 99.7 F (37.6 C) (Oral)   Resp 20   SpO2 96%   Visual Acuity Right Eye Distance:   Left Eye Distance:   Bilateral Distance:    Right Eye Near:   Left Eye Near:    Bilateral Near:     Physical Exam Vitals and nursing note reviewed.  Constitutional:      General: He is not in acute distress.    Appearance: He is well-developed. He is ill-appearing. He is not toxic-appearing.  HENT:     Head: Normocephalic and atraumatic.     Right Ear: Hearing, tympanic membrane, ear canal and external ear normal.     Left Ear: Hearing, tympanic membrane, ear canal and external ear normal.     Nose: Mucosal edema, congestion and rhinorrhea present. Rhinorrhea is clear.     Right Sinus: No maxillary sinus tenderness or frontal sinus tenderness.     Left Sinus: No maxillary sinus tenderness or frontal sinus tenderness.     Mouth/Throat:     Lips: Pink.     Mouth: Mucous membranes are moist.     Pharynx: Uvula midline. No oropharyngeal exudate or posterior oropharyngeal erythema.     Tonsils: No tonsillar exudate.  Eyes:     Conjunctiva/sclera: Conjunctivae normal.     Pupils: Pupils are equal, round, and reactive to light.  Cardiovascular:     Rate and Rhythm: Normal rate and regular rhythm.     Heart sounds: S1 normal and S2 normal. No murmur heard. Pulmonary:     Effort: Pulmonary effort is normal. No respiratory distress.     Breath sounds: Examination of the right-upper field reveals wheezing. Examination of the left-upper field reveals wheezing. Examination of the right-lower field reveals decreased breath sounds. Examination of the left-lower field reveals decreased breath sounds. Decreased breath sounds and wheezing present. No  rhonchi or rales.  Abdominal:     General: Bowel sounds are normal.     Palpations: Abdomen is soft.     Tenderness: There is no abdominal tenderness.  Musculoskeletal:        General: No swelling.     Cervical back: Neck supple.  Lymphadenopathy:     Head:     Right side of head: No submental, submandibular, tonsillar, preauricular or posterior auricular adenopathy.     Left side of head: No submental, submandibular, tonsillar, preauricular or posterior auricular adenopathy.     Cervical: Cervical adenopathy present.     Right cervical: Superficial cervical adenopathy present.     Left cervical: Superficial cervical adenopathy present.  Skin:    General: Skin is warm and dry.     Capillary Refill: Capillary refill takes less than 2 seconds.     Findings: No rash.  Neurological:     Mental Status: He is oriented to person, place, and time. He is lethargic.  Psychiatric:        Mood and Affect: Mood normal.      UC Treatments / Results  Labs (all labs ordered are listed, but only abnormal results are displayed) Labs Reviewed  POCT INFLUENZA A/B - Abnormal; Notable for the following components:      Result Value   Influenza A, POC Positive (*)    All other components within normal limits    EKG   Radiology No results found.  Procedures Procedures (including critical care time)  Medications Ordered in UC Medications  acetaminophen (TYLENOL) tablet 650 mg (650 mg Oral Given 07/18/23 1445)  cefTRIAXone (ROCEPHIN) injection  1 g (1 g Intramuscular Given 07/18/23 1547)    Initial Impression / Assessment and Plan / UC Course  I have reviewed the triage vital signs and the nursing notes.  Pertinent labs & imaging results that were available during my care of the patient were reviewed by me and considered in my medical decision making (see chart for details).  Clinical Course as of 07/18/23 1550  Mon Jul 18, 2023  1537 DG Chest 2 View [SJ]    Clinical Course User  Index [SJ] Prescilla Sours, FNP    Please see patient discharge instructions below for specific instructions given verbally and in writing to the patient.  He is positive for influenza type a and his chest x-ray does not show consolidation but appears to me to be hazy and could indicate early pneumonia.  Given his recent COVID infection and current flu infection I am going to treat him for pneumonia.  Rocephin 1 g IM now.  Cefdinir 300 mg twice daily for 7 days.  Promethazine DM, 5 mL every 6 hours as needed for cough.  Albuterol inhaler added after the AVS printed, 2 puffs, every 4 hours if needed for wheezing.  Provided Tamiflu 75 mg twice daily for 5 days but I advised that I do not think he needs to take that.  I think he would be better served to take the antibiotics and get over his chest congestion/pneumonia.  Get plenty of fluids and rest.  Follow-up in 2 to 3 weeks for recheck here or with primary care return sooner if needed. Final Clinical Impressions(s) / UC Diagnoses   Final diagnoses:  Acute cough  Fever, unspecified  Type A influenza  Community acquired pneumonia, unspecified laterality     Discharge Instructions      Chest x-ray is a little hazy but no consolidation.  He is positive for influenza type A.  He had COVID on 07/04/2023.  I believe he has early pneumonia and I am going to treat him for it.  Will update him if the radiology review changes the plan of care.  Rocephin 1 g IM now.  Cefdinir, 300 mg twice daily for 7 days.  Promethazine DM, 5 mL every 6 hours as needed for cough.  Get plenty of fluids and rest.  Provided a prescription for Tamiflu but I discussed with him that I do not think he needs to take the Tamiflu.  I believe he will get through the flu and he mostly needs to take the medicine for pneumonia.  Follow-up if symptoms do not improve, worsen or new symptoms occur and needs a 2 or 3-week recheck with possible repeat chest x-ray here or at the next primary  care visit.     ED Prescriptions     Medication Sig Dispense Auth. Provider   oseltamivir (TAMIFLU) 75 MG capsule Take 1 capsule (75 mg total) by mouth every 12 (twelve) hours. 10 capsule Prescilla Sours, FNP   cefdinir (OMNICEF) 300 MG capsule Take 1 capsule (300 mg total) by mouth 2 (two) times daily after a meal for 7 days. 14 capsule Prescilla Sours, FNP   promethazine-dextromethorphan (PROMETHAZINE-DM) 6.25-15 MG/5ML syrup Take 5 mLs by mouth 4 (four) times daily as needed for cough. Do not use and drive - May make drowsy. 118 mL Prescilla Sours, FNP   albuterol (VENTOLIN HFA) 108 (90 Base) MCG/ACT inhaler Inhale 2 puffs into the lungs every 4 (four) hours as needed for wheezing or shortness of breath. 6.7 g Micala Saltsman,  Maralyn Sago, FNP      PDMP not reviewed this encounter.   Prescilla Sours, FNP 07/18/23 1550

## 2023-07-18 NOTE — ED Triage Notes (Signed)
 Pt reports started yesterday coughing , mucus that turned green, shortness of breath and fatigue.

## 2023-07-19 NOTE — Progress Notes (Signed)
 Chest X- Ray IMPRESSION:  No active cardiopulmonary disease.  Patient was updated on these results during the Urgent Care visit.  However, his clinical presentation was most consistent with early pneumonia and he was treated with antibiotics.  He was told that his chest x-ray did not clearly show pneumonia.

## 2023-09-09 DIAGNOSIS — I4510 Unspecified right bundle-branch block: Secondary | ICD-10-CM | POA: Diagnosis not present

## 2023-09-13 ENCOUNTER — Inpatient Hospital Stay (HOSPITAL_COMMUNITY)
Admission: AD | Admit: 2023-09-13 | Discharge: 2023-09-18 | DRG: 885 | Disposition: A | Discharge status: Home / Self Care | Source: Intra-hospital | Attending: Psychiatry | Admitting: Psychiatry

## 2023-09-13 DIAGNOSIS — Z8249 Family history of ischemic heart disease and other diseases of the circulatory system: Secondary | ICD-10-CM

## 2023-09-13 DIAGNOSIS — Z833 Family history of diabetes mellitus: Secondary | ICD-10-CM

## 2023-09-13 DIAGNOSIS — Z7984 Long term (current) use of oral hypoglycemic drugs: Secondary | ICD-10-CM | POA: Diagnosis not present

## 2023-09-13 DIAGNOSIS — F322 Major depressive disorder, single episode, severe without psychotic features: Secondary | ICD-10-CM | POA: Diagnosis present

## 2023-09-13 DIAGNOSIS — Z79899 Other long term (current) drug therapy: Secondary | ICD-10-CM

## 2023-09-13 DIAGNOSIS — E785 Hyperlipidemia, unspecified: Secondary | ICD-10-CM | POA: Diagnosis present

## 2023-09-13 DIAGNOSIS — G8929 Other chronic pain: Secondary | ICD-10-CM | POA: Diagnosis present

## 2023-09-13 DIAGNOSIS — Z5986 Financial insecurity: Secondary | ICD-10-CM | POA: Diagnosis not present

## 2023-09-13 DIAGNOSIS — Z841 Family history of disorders of kidney and ureter: Secondary | ICD-10-CM

## 2023-09-13 DIAGNOSIS — K760 Fatty (change of) liver, not elsewhere classified: Secondary | ICD-10-CM | POA: Diagnosis present

## 2023-09-13 DIAGNOSIS — Z800 Family history of malignant neoplasm of digestive organs: Secondary | ICD-10-CM | POA: Diagnosis not present

## 2023-09-13 DIAGNOSIS — G959 Disease of spinal cord, unspecified: Secondary | ICD-10-CM | POA: Diagnosis present

## 2023-09-13 DIAGNOSIS — K219 Gastro-esophageal reflux disease without esophagitis: Secondary | ICD-10-CM | POA: Diagnosis present

## 2023-09-13 DIAGNOSIS — R4587 Impulsiveness: Secondary | ICD-10-CM | POA: Diagnosis present

## 2023-09-13 DIAGNOSIS — Z881 Allergy status to other antibiotic agents status: Secondary | ICD-10-CM | POA: Diagnosis not present

## 2023-09-13 DIAGNOSIS — F1114 Opioid abuse with opioid-induced mood disorder: Secondary | ICD-10-CM | POA: Diagnosis present

## 2023-09-13 DIAGNOSIS — Z860100 Personal history of colon polyps, unspecified: Secondary | ICD-10-CM

## 2023-09-13 DIAGNOSIS — R7303 Prediabetes: Secondary | ICD-10-CM | POA: Diagnosis present

## 2023-09-13 DIAGNOSIS — I10 Essential (primary) hypertension: Secondary | ICD-10-CM | POA: Diagnosis present

## 2023-09-13 DIAGNOSIS — T5492XA Toxic effect of unspecified corrosive substance, intentional self-harm, initial encounter: Secondary | ICD-10-CM | POA: Diagnosis present

## 2023-09-13 DIAGNOSIS — Z981 Arthrodesis status: Secondary | ICD-10-CM | POA: Diagnosis not present

## 2023-09-13 DIAGNOSIS — M47816 Spondylosis without myelopathy or radiculopathy, lumbar region: Secondary | ICD-10-CM | POA: Diagnosis present

## 2023-09-13 MED ORDER — HALOPERIDOL LACTATE 5 MG/ML IJ SOLN: 5.0000 mg | Freq: Three times a day (TID) | INTRAMUSCULAR | Status: DC | PRN

## 2023-09-13 MED ORDER — LORAZEPAM 2 MG/ML IJ SOLN: 2.0000 mg | Freq: Three times a day (TID) | INTRAMUSCULAR | Status: DC | PRN

## 2023-09-13 MED ORDER — ALUM & MAG HYDROXIDE-SIMETH 200-200-20 MG/5ML PO SUSP: 30.0000 mL | ORAL | Status: DC | PRN

## 2023-09-13 MED ORDER — HALOPERIDOL LACTATE 5 MG/ML IJ SOLN: 10.0000 mg | Freq: Three times a day (TID) | INTRAMUSCULAR | Status: DC | PRN

## 2023-09-13 MED ORDER — TRAZODONE HCL 50 MG PO TABS
50.0000 mg | ORAL_TABLET | Freq: Every evening | ORAL | Status: DC | PRN
  Filled 2023-09-13: qty 1

## 2023-09-13 MED ORDER — DIPHENHYDRAMINE HCL 25 MG PO CAPS: 50.0000 mg | ORAL_CAPSULE | Freq: Three times a day (TID) | ORAL | Status: DC | PRN

## 2023-09-13 MED ORDER — HALOPERIDOL 5 MG PO TABS: 5.0000 mg | ORAL_TABLET | Freq: Three times a day (TID) | ORAL | Status: DC | PRN

## 2023-09-13 MED ORDER — MAGNESIUM HYDROXIDE 400 MG/5ML PO SUSP: 30.0000 mL | Freq: Every day | ORAL | Status: DC | PRN

## 2023-09-13 MED ORDER — DIPHENHYDRAMINE HCL 50 MG/ML IJ SOLN: 50.0000 mg | Freq: Three times a day (TID) | INTRAMUSCULAR | Status: DC | PRN

## 2023-09-13 MED ORDER — ACETAMINOPHEN 325 MG PO TABS: 650.0000 mg | ORAL_TABLET | Freq: Four times a day (QID) | ORAL | Status: DC | PRN

## 2023-09-13 MED ORDER — HYDROXYZINE HCL 25 MG PO TABS: 25.0000 mg | ORAL_TABLET | Freq: Three times a day (TID) | ORAL | Status: DC | PRN

## 2023-09-14 ENCOUNTER — Encounter (HOSPITAL_COMMUNITY): Payer: Self-pay | Admitting: Psychiatry

## 2023-09-14 ENCOUNTER — Encounter (HOSPITAL_COMMUNITY): Payer: Self-pay

## 2023-09-14 ENCOUNTER — Other Ambulatory Visit: Payer: Self-pay

## 2023-09-14 MED ORDER — OXYCODONE-ACETAMINOPHEN 5-325 MG PO TABS
2.0000 | ORAL_TABLET | Freq: Two times a day (BID) | ORAL | Status: DC
  Administered 2023-09-14 – 2023-09-18 (×9): 2 via ORAL
  Filled 2023-09-14 (×9): qty 2

## 2023-09-14 MED ORDER — DULOXETINE HCL 30 MG PO CPEP
30.0000 mg | ORAL_CAPSULE | Freq: Every day | ORAL | Status: DC
  Administered 2023-09-14 – 2023-09-15 (×2): 30 mg via ORAL
  Filled 2023-09-14 (×4): qty 1

## 2023-09-14 MED ORDER — METFORMIN HCL ER 500 MG PO TB24
500.0000 mg | ORAL_TABLET | Freq: Every day | ORAL | Status: DC
  Administered 2023-09-14 – 2023-09-18 (×5): 500 mg via ORAL
  Filled 2023-09-14 (×9): qty 1

## 2023-09-14 MED ORDER — PANTOPRAZOLE SODIUM 40 MG PO TBEC
40.0000 mg | DELAYED_RELEASE_TABLET | Freq: Two times a day (BID) | ORAL | Status: DC
  Administered 2023-09-14 – 2023-09-18 (×8): 40 mg via ORAL
  Filled 2023-09-14 (×12): qty 1

## 2023-09-14 MED ORDER — AMLODIPINE BESYLATE 10 MG PO TABS
10.0000 mg | ORAL_TABLET | Freq: Every day | ORAL | Status: DC
  Administered 2023-09-14 – 2023-09-18 (×5): 10 mg via ORAL
  Filled 2023-09-14 (×4): qty 1
  Filled 2023-09-14: qty 2
  Filled 2023-09-14 (×3): qty 1

## 2023-09-14 MED ORDER — METHOCARBAMOL 750 MG PO TABS
750.0000 mg | ORAL_TABLET | Freq: Three times a day (TID) | ORAL | Status: DC
Start: 2023-09-14 — End: 2023-09-18
  Administered 2023-09-14 – 2023-09-18 (×13): 750 mg via ORAL
  Filled 2023-09-14 (×19): qty 1
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
## 2023-09-14 NOTE — Plan of Care (Signed)
   Problem: Education: Goal: Emotional status will improve Outcome: Progressing Goal: Mental status will improve Outcome: Progressing

## 2023-09-14 NOTE — Group Note (Signed)
 Date:  09/14/2023 Time:  1:46 PM  Group Topic/Focus:  Goals Group:   The focus of this group is to help patients establish daily goals to achieve during treatment and discuss how the patient can incorporate goal setting into their daily lives to aide in recovery. Orientation:   The focus of this group is to educate the patient on the purpose and policies of crisis stabilization and provide a format to answer questions about their admission.  The group details unit policies and expectations of patients while admitted.    Participation Level:  Did Not Attend   Sheryl Donna 09/14/2023, 1:46 PM

## 2023-09-14 NOTE — Group Note (Signed)
 Recreation Therapy Group Note   Group Topic:Communication  Group Date: 09/14/2023 Start Time: 0933 End Time: 1013 Facilitators: Alexy Heldt-McCall, LRT,CTRS Location: [address] Dayroom   Group Topic: Communication, Problem Solving   Goal Area(s) Addresses:  Patient will effectively listen to complete activity.  Patient will identify communication skills used to make activity successful.  Patient will identify how skills used during activity can be used to reach post d/c goals.    Behavioral Response: Active   Intervention: Building surveyor Activity - Geometric pattern cards, pencils, blank paper    Activity: Geometric Drawings.  Three volunteers from the peer group will be shown an abstract picture with a particular arrangement of geometrical shapes.  Each round, one 'speaker' will describe the pattern, as accurately as possible without revealing the image to the group.  The remaining group members will listen and draw the picture to reflect how it is described to them. Patients with the role of 'listener' cannot ask clarifying questions but, may request that the speaker repeat a direction. Once the drawings are complete, the presenter will show the rest of the group the picture and compare how close each person came to drawing the picture. LRT will facilitate a post-activity discussion regarding effective communication and the importance of planning, listening, and asking for clarification in daily interactions with others.  Education: Environmental consultant, Active listening, Support systems, Discharge planning  Education Outcome: Acknowledges understanding/In group clarification offered/Needs additional education.    Affect/Mood: Appropriate   Participation Level: Active   Participation Quality: Independent   Behavior: Appropriate   Speech/Thought Process: Relevant   Insight: Moderate   Judgement: Moderate   Modes of Intervention: Activity   Patient Response to  Interventions:  Engaged   Education Outcome:  In group clarification offered    Clinical Observations/Individualized Feedback: Pt came in near the end of group. Pt was attentive and tried to follow along with the descriptions given during group. Pt was appropriate throughout group session.     Plan: Continue to engage patient in RT group sessions 2-3x/week.   [name], LRT,CTRS 09/14/2023 12:45 PM

## 2023-09-14 NOTE — Group Note (Signed)
 Date:  09/14/2023 Time:  1:32 PM  Group Topic/Focus:  Emotional Education:   The focus of this group is to discuss unhealthy thought patterns and how they impact mental health.     Participation Level:  Active  Participation Quality:  Appropriate  Affect:  Appropriate  Cognitive:  Appropriate  Insight: Appropriate  Engagement in Group:  Engaged  Modes of Intervention:  Discussion and Education  Additional Comments:    Josede Cicero D Koraline Phillipson 09/14/2023, 1:32 PM

## 2023-09-14 NOTE — Progress Notes (Signed)
   09/14/23 0900  Psych Admission Type (Psych Patients Only)  Admission Status Voluntary  Psychosocial Assessment  Patient Complaints Self-harm behaviors;Anxiety;Depression  Eye Contact Fair  Facial Expression Flat  Affect Appropriate to circumstance  Speech Logical/coherent  Interaction Assertive  Motor Activity Slow  Appearance/Hygiene Unremarkable  Behavior Characteristics Cooperative  Mood Depressed;Anxious  Thought Process  Coherency WDL  Content WDL  Delusions None reported or observed  Perception WDL  Hallucination None reported or observed  Judgment Impaired  Confusion None  Danger to Self  Current suicidal ideation? Denies  Danger to Others  Danger to Others None reported or observed

## 2023-09-14 NOTE — BH Assessment (Signed)
 Patient admitted to room 300-2. Skin assessment complete. Pt has no marks, open wounds or scratches. Pt does have a reddened area on lt arm at bend of elbow where he states his IV was located. Will monitor. Pt also has scars along spine and stated he has had 4 back surgeries. Pt lives with his wife and step-daughter. He does not have a psych history. Pt states he drank some bleach d/t some financial problems. Denies SI/HI and AVH. Pt states he wants to work on his communication skills and getting back on his feet. Pt was oriented to unit and denies wanting to take anything for sleep. Pleasant and cooperative.

## 2023-09-14 NOTE — Plan of Care (Signed)
   Problem: Education: Goal: Knowledge of [facility] Education information/materials will improve Outcome: Progressing Goal: Emotional status will improve Outcome: Progressing Goal: Mental status will improve Outcome: Progressing
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
## 2023-09-14 NOTE — Plan of Care (Signed)
   Problem: Education: Goal: Knowledge of [facility] Education information/materials will improve Outcome: Progressing Goal: Emotional status will improve Outcome: Progressing Goal: Mental status will improve Outcome: Progressing Goal: Verbalization of understanding the information provided will improve Outcome: Progressing

## 2023-09-14 NOTE — [facility] Group Notes (Signed)
Patient attended the NA group. ?

## 2023-09-14 NOTE — BH IP Treatment Plan (Signed)
 Interdisciplinary Treatment and Diagnostic Plan Update  09/14/2023 Time of Session: 1105am [name] MRN: [id number]  Principal Diagnosis: MDD (major depressive disorder), severe (HCC)  Secondary Diagnoses: Principal Problem:   MDD (major depressive disorder), severe (HCC)   Current Medications:  Current Facility-Administered Medications  Medication Dose Route Frequency Provider Last Rate Last Admin   acetaminophen  (TYLENOL ) tablet 650 mg  650 mg Oral Q6H PRN Coleman, Carolyn H, NP       alum & mag hydroxide-simeth (MAALOX/MYLANTA) 200-200-20 MG/5ML suspension 30 mL  30 mL Oral Q4H PRN Coleman, Carolyn H, NP       haloperidol (HALDOL) tablet 5 mg  5 mg Oral TID PRN Costella Dirks, NP       And   diphenhydrAMINE (BENADRYL) capsule 50 mg  50 mg Oral TID PRN Costella Dirks, NP       haloperidol lactate (HALDOL) injection 5 mg  5 mg Intramuscular TID PRN Costella Dirks, NP       And   diphenhydrAMINE (BENADRYL) injection 50 mg  50 mg Intramuscular TID PRN Coleman, Carolyn H, NP       And   LORazepam (ATIVAN) injection 2 mg  2 mg Intramuscular TID PRN Costella Dirks, NP       haloperidol lactate (HALDOL) injection 10 mg  10 mg Intramuscular TID PRN Costella Dirks, NP       And   diphenhydrAMINE (BENADRYL) injection 50 mg  50 mg Intramuscular TID PRN Coleman, Carolyn H, NP       And   LORazepam (ATIVAN) injection 2 mg  2 mg Intramuscular TID PRN Costella Dirks, NP       DULoxetine (CYMBALTA) DR capsule 30 mg  30 mg Oral Daily Parker, Alvin S, MD   30 mg at 09/14/23 1243   hydrOXYzine (ATARAX) tablet 25 mg  25 mg Oral TID PRN Coleman, Carolyn H, NP       magnesium hydroxide (MILK OF MAGNESIA) suspension 30 mL  30 mL Oral Daily PRN Coleman, Carolyn H, NP       methocarbamol  (ROBAXIN ) tablet 750 mg  750 mg Oral TID Parker, Alvin S, MD   750 mg at 09/14/23 1243   oxyCODONE -acetaminophen  (PERCOCET/ROXICET) 5-325 MG per tablet 2 tablet  2 tablet Oral BID Parker,  Alvin S, MD   2 tablet at 09/14/23 1243   traZODone (DESYREL) tablet 50 mg  50 mg Oral QHS PRN Coleman, Carolyn H, NP       PTA Medications: Medications Prior to Admission  Medication Sig Dispense Refill Last Dose/Taking   amLODipine (NORVASC) 10 MG tablet Take 10 mg by mouth daily.   Past Week   Cyanocobalamin  (VITAMIN B-12 SL) Place 5,000 mcg under the tongue once a week.   Past Week   hydrochlorothiazide (HYDRODIURIL) 12.5 MG tablet Take 12.5 mg by mouth daily as needed (fluid).   Past Month   metFORMIN (GLUCOPHAGE-XR) 500 MG 24 hr tablet Take 500 mg by mouth daily.   Past Week   methocarbamol  (ROBAXIN ) 750 MG tablet Take 750 mg by mouth 3 (three) times daily.   Taking   oxyCODONE -acetaminophen  (PERCOCET) 10-325 MG tablet Take 1 tablet by mouth in the morning and at bedtime.   09/13/2023   pantoprazole  (PROTONIX ) 40 MG tablet Take 40 mg by mouth daily as needed (indigestion).   Past Week    Patient Stressors:    Patient Strengths:    Treatment Modalities: Medication Management, Group therapy, Case management,  1 to 1 session with clinician, Psychoeducation, Recreational therapy.   Physician Treatment Plan for Primary Diagnosis: MDD (major depressive disorder), severe (HCC) Long Term Goal(s):     Short Term Goals:    Medication Management: Evaluate patient's response, side effects, and tolerance of medication regimen.  Therapeutic Interventions: 1 to 1 sessions, Unit Group sessions and Medication administration.  Evaluation of Outcomes: Not Progressing  Physician Treatment Plan for Secondary Diagnosis: Principal Problem:   MDD (major depressive disorder), severe (HCC)  Long Term Goal(s):     Short Term Goals:       Medication Management: Evaluate patient's response, side effects, and tolerance of medication regimen.  Therapeutic Interventions: 1 to 1 sessions, Unit Group sessions and Medication administration.  Evaluation of Outcomes: Not Progressing   RN Treatment  Plan for Primary Diagnosis: MDD (major depressive disorder), severe (HCC) Long Term Goal(s): Knowledge of disease and therapeutic regimen to maintain health will improve  Short Term Goals: Ability to remain free from injury will improve, Ability to verbalize frustration and anger appropriately will improve, Ability to demonstrate self-control, Ability to participate in decision making will improve, Ability to verbalize feelings will improve, Ability to disclose and discuss suicidal ideas, Ability to identify and develop effective coping behaviors will improve, and Compliance with prescribed medications will improve  Medication Management: RN will administer medications as ordered by provider, will assess and evaluate patient's response and provide education to patient for prescribed medication. RN will report any adverse and/or side effects to prescribing provider.  Therapeutic Interventions: 1 on 1 counseling sessions, Psychoeducation, Medication administration, Evaluate responses to treatment, Monitor vital signs and CBGs as ordered, Perform/monitor CIWA, COWS, AIMS and Fall Risk screenings as ordered, Perform wound care treatments as ordered.  Evaluation of Outcomes: Not Progressing   LCSW Treatment Plan for Primary Diagnosis: MDD (major depressive disorder), severe (HCC) Long Term Goal(s): Safe transition to appropriate next level of care at discharge, Engage patient in therapeutic group addressing interpersonal concerns.  Short Term Goals: Engage patient in aftercare planning with referrals and resources, Increase social support, Increase ability to appropriately verbalize feelings, Increase emotional regulation, Facilitate acceptance of mental health diagnosis and concerns, Facilitate patient progression through stages of change regarding substance use diagnoses and concerns, Identify triggers associated with mental health/substance abuse issues, and Increase skills for wellness and  recovery  Therapeutic Interventions: Assess for all discharge needs, 1 to 1 time with Social worker, Explore available resources and support systems, Assess for adequacy in community support network, Educate family and significant other(s) on suicide prevention, Complete Psychosocial Assessment, Interpersonal group therapy.  Evaluation of Outcomes: Not Progressing   Progress in Treatment: Attending groups: Yes. Participating in groups: Yes. Taking medication as prescribed: Yes. Toleration medication: Yes. Family/Significant other contact made: No, will contact:  consents pending Patient understands diagnosis: Yes. Discussing patient identified problems/goals with staff: Yes. Medical problems stabilized or resolved: Yes. Denies suicidal/homicidal ideation: Yes. Issues/concerns per patient self-inventory: No.  New problem(s) identified: No, Describe:  none  New Short Term/Long Term Goal(s): medication stabilization, elimination of SI thoughts, development of comprehensive mental wellness plan.    Patient Goals:  "Be more open with communication and feelings, medication adjustment"   Discharge Plan or Barriers: Patient recently admitted. CSW will continue to follow and assess for appropriate referrals and possible discharge planning.    Reason for Continuation of Hospitalization: Anxiety Depression Medication stabilization Suicidal ideation  Estimated Length of Stay: 5-7 days  Last 3 [place] Suicide Severity Risk Score: Flowsheet Row  [facility] (Current[facility]18/2023 in [facility] from 07/04/2023 in [facility]  C-SSRS RISK CATEGORY High Risk No Risk No Risk       Last PHQ 2/9 Scores:     No data to display          Scribe for Treatment Team: Vonzell Guerin, Milinda Allen 09/14/2023 1:37 PM

## 2023-09-15 LAB — CBC WITH DIFFERENTIAL/PLATELET
Abs Immature Granulocytes: 0.02 10*3/uL (ref 0.00–0.07)
Basophils Absolute: 0 10*3/uL (ref 0.0–0.1)
Basophils Relative: 0 %
Eosinophils Absolute: 0.1 10*3/uL (ref 0.0–0.5)
Eosinophils Relative: 3 %
HCT: 46.8 % (ref 39.0–52.0)
Hemoglobin: 15 g/dL (ref 13.0–17.0)
Immature Granulocytes: 0 %
Lymphocytes Relative: 32 %
Lymphs Abs: 1.7 10*3/uL (ref 0.7–4.0)
MCH: 30.6 pg (ref 26.0–34.0)
MCHC: 32.1 g/dL (ref 30.0–36.0)
MCV: 95.5 fL (ref 80.0–100.0)
Monocytes Absolute: 0.4 10*3/uL (ref 0.1–1.0)
Monocytes Relative: 8 %
Neutro Abs: 3 10*3/uL (ref 1.7–7.7)
Neutrophils Relative %: 57 %
Platelets: 435 10*3/uL — ABNORMAL HIGH (ref 150–400)
RBC: 4.9 MIL/uL (ref 4.22–5.81)
RDW: 12.9 % (ref 11.5–15.5)
WBC: 5.2 10*3/uL (ref 4.0–10.5)
nRBC: 0 % (ref 0.0–0.2)

## 2023-09-15 LAB — COMPREHENSIVE METABOLIC PANEL WITH GFR
ALT: 46 U/L — ABNORMAL HIGH (ref 0–44)
AST: 31 U/L (ref 15–41)
Albumin: 4.4 g/dL (ref 3.5–5.0)
Alkaline Phosphatase: 62 U/L (ref 38–126)
Anion gap: 9 (ref 5–15)
BUN: 8 mg/dL (ref 6–20)
CO2: 27 mmol/L (ref 22–32)
Calcium: 9.2 mg/dL (ref 8.9–10.3)
Chloride: 101 mmol/L (ref 98–111)
Creatinine, Ser: 0.79 mg/dL (ref 0.61–1.24)
GFR, Estimated: >60 mL/min (ref >60)
Glucose, Bld: 113 mg/dL — ABNORMAL HIGH (ref 70–99)
Potassium: 3.4 mmol/L — ABNORMAL LOW (ref 3.5–5.1)
Sodium: 137 mmol/L (ref 135–145)
Total Bilirubin: 1.3 mg/dL — ABNORMAL HIGH (ref 0.0–1.2)
Total Protein: 8.2 g/dL — ABNORMAL HIGH (ref 6.5–8.1)

## 2023-09-15 LAB — LIPID PANEL
Cholesterol: 140 mg/dL (ref 0–200)
HDL: 53 mg/dL (ref >40)
LDL Cholesterol: 71 mg/dL (ref 0–99)
Total CHOL/HDL Ratio: 2.6 ratio
Triglycerides: 79 mg/dL (ref <150)
VLDL: 16 mg/dL (ref 0–40)

## 2023-09-15 LAB — VITAMIN B12: Vitamin B-12: 370 pg/mL (ref 180–914)

## 2023-09-15 LAB — FOLATE: Folate: 9.6 ng/mL (ref >5.9)

## 2023-09-15 LAB — TSH: TSH: 1.32 u[IU]/mL (ref 0.350–4.500)

## 2023-09-15 LAB — RPR: RPR Ser Ql: NONREACTIVE

## 2023-09-15 LAB — HEMOGLOBIN A1C
Hgb A1c MFr Bld: 4.9 % (ref 4.8–5.6)
Mean Plasma Glucose: 93.93 mg/dL

## 2023-09-15 LAB — VITAMIN D 25 HYDROXY (VIT D DEFICIENCY, FRACTURES): Vit D, 25-Hydroxy: 45.48 ng/mL (ref 30–100)

## 2023-09-15 MED ORDER — DULOXETINE HCL 30 MG PO CPEP
30.0000 mg | ORAL_CAPSULE | Freq: Two times a day (BID) | ORAL | Status: DC
  Administered 2023-09-15 – 2023-09-16 (×2): 30 mg via ORAL
  Filled 2023-09-15 (×4): qty 1

## 2023-09-15 NOTE — BHH Group Notes (Signed)
 The focus of this group is to help patients establish daily goals to achieve during treatment and discuss how the patient can incorporate goal setting into their daily lives to aide in recovery.      Scale 1-10  10    Goal: enjoy the blessing of making it to another day

## 2023-09-15 NOTE — Plan of Care (Signed)
   Problem: Education: Goal: Knowledge of [facility] Education information/materials will improve Outcome: Progressing Goal: Emotional status will improve Outcome: Progressing Goal: Mental status will improve Outcome: Progressing

## 2023-09-15 NOTE — Progress Notes (Addendum)
 [facility] MD Progress Note  09/15/2023 6:38 AM [name]  MRN:  [id number]  Principal Problem: MDD (major depressive disorder), severe (HCC) Diagnosis: Principal Problem:   MDD (major depressive disorder), severe (HCC)  Reason for Admission:  [name] is a 53 y.o. male  with a past psychiatric history of none. Patient initially arrived to [facility] on May 9 after drinking 1 styrofoam cup of Clorox cleaner and feeling his chest burning and shortness of breath.  He was admitted to [facility] had an EGD on May 10 and was discharged on May 13 and admitted to [facility] under IVC on May 14 for acute safety concerns and acute suicidal or self-harming behaviors. PPHx is significant for none, and no history of Suicide Attempts, Self Injurious Behavior, or Prior Psychiatric Hospitalizations. PMHx is significant for chronic pain, HTN, prediabetes, multiple back surgeries  (admitted on 09/13/2023, total  LOS: 2 days )  Chart review: Overnight Events Vital signs: elevated BP 134/94 MAR was reviewed and patient was compliant with medications.  Patient received no PRN  Labs: TSH wnl, K 3.4, folate wnl, TSH wnl, vit B12 wnl, vit D wnl, lipid panel A1c wnl Sleep: 7.5 hours  Nursing notes /groups: attended 2/3 groups  Pertinent information discussed during bed progression:  Case was discussed in the multidisciplinary team. Slept 7.5 hours, denied SI/HI/AVH, reports is pleasant.   Information Obtained Today During Patient Interview: Patient reports no issues with sleep and appetite. He reports his mood is "back to himself". He reports pain is better. He denies side effects to medications. Discussed plan to increase cymbalta tomorrow. He reports a good visit with his wife. He continues to report suicide attempt was impulsive. He agrees at the time he was feeling more depressed and now that "things are out in the open" he feels better. He denies physical symptoms. When asked about modifiable risk  factors he reports he will communicate with wife and daughter. He reports NA group was an eye-opener. He reports he will not try to resolve things on his own. He reports his goal is to enjoy another day. He denies current SI/HI/AVH.   Past Psychiatric Hx: Current Psychiatrist: Denies Current Therapist: Denies Previous Psychiatric Diagnoses: Denies Current psychiatric medications: Denies Psychiatric medication history/compliance: Denies Psychiatric Hospitalization hx: Denies Psychotherapy hx: Denies Neuromodulation history: Denies History of suicide (obtained from HPI): Denies History of homicide or aggression (obtained in HPI): Denies   Substance Abuse Hx: (Frequency, quantity, last use, impact) Alcohol:  denies Tobacco: denies Cannabis: denies Other Illicit drugs: buying extra percocet  Rx drug abuse: buying extra percocet Rehab hx: denies   Past Medical History: PCP: Dentist Dx: DJD, GERD, HTN, dyslipidemia, prediabetes Medications: metformin, amlodipine, hydrochlorothiazide PRN, robaxin , percocet, protonix  Allergies: vancomycin  Surgeries: S/p revision L3-S1 posterior spinal fusion 08/2016 Trauma: denies Seizures: denies   Family Medical History:      Family History  Problem Relation Age of Onset   Stomach cancer Father     Diabetes Father     Kidney disease Father     Hypertension Father     Esophageal cancer Neg Hx     Rectal cancer Neg Hx     Colon cancer Neg Hx            Family Psychiatric History: Psychiatric Dx: denies Suicide Hx: denies Violence/Aggression: denies Substance use: denies   Social History: He reports that he has been on disability since 2013 for his back issues.  He reports  that he lives with his wife and stepdaughter who is 54 and temporarily living with him.  He denies any legal history.  He denies any military history.  He denies any access to firearms.  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  He reports that he has been married to his wife for 29 years.  He gives permission to contact his wife Aurora Blowers for additional information.   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   Prediabetes    Family History:  Family History  Problem Relation Age of Onset   Stomach cancer Father    Diabetes Father    Kidney disease Father    Hypertension Father    Esophageal cancer Neg Hx    Rectal cancer Neg Hx    Colon cancer Neg Hx     Current Medications: Current Facility-Administered Medications  Medication Dose Route Frequency Provider Last Rate Last Admin   acetaminophen  (TYLENOL ) tablet 650 mg  650 mg Oral Q6H PRN Coleman, Carolyn H, NP       alum & mag hydroxide-simeth (MAALOX/MYLANTA) 200-200-20 MG/5ML suspension 30 mL  30 mL Oral Q4H PRN Costella Dirks, NP       amLODipine (NORVASC) tablet 10 mg  10 mg Oral Daily Leeona Mccardle, MD   10 mg at 09/14/23 1434   haloperidol (HALDOL) tablet 5 mg  5 mg Oral TID PRN Costella Dirks, NP       And   diphenhydrAMINE (BENADRYL) capsule 50 mg  50 mg Oral TID PRN Costella Dirks, NP       haloperidol lactate (HALDOL) injection 5 mg  5 mg Intramuscular TID PRN Costella Dirks, NP       And   diphenhydrAMINE (BENADRYL) injection 50 mg  50 mg Intramuscular TID PRN Costella Dirks, NP       And   LORazepam (ATIVAN) injection 2 mg  2 mg Intramuscular TID PRN Costella Dirks, NP       haloperidol lactate (HALDOL) injection 10 mg  10 mg Intramuscular TID PRN Costella Dirks, NP       And    diphenhydrAMINE (BENADRYL) injection 50 mg  50 mg Intramuscular TID PRN Coleman, Carolyn H, NP       And   LORazepam (ATIVAN) injection 2 mg  2 mg Intramuscular TID PRN Costella Dirks, NP       DULoxetine (CYMBALTA) DR capsule 30 mg  30 mg Oral Daily Parker, Alvin S, MD   30 mg at 09/14/23 1243   hydrOXYzine (ATARAX) tablet 25 mg  25 mg Oral TID PRN Costella Dirks, NP       magnesium hydroxide (MILK OF MAGNESIA) suspension 30 mL  30 mL Oral Daily PRN Costella Dirks, NP       metFORMIN (GLUCOPHAGE-XR) 24 hr tablet 500 mg  500 mg Oral Daily Mattie Novosel, MD   500 mg at 09/14/23 1627   methocarbamol  (ROBAXIN ) tablet 750  mg  750 mg Oral TID Parker, Alvin S, MD   750 mg at 09/14/23 1627   oxyCODONE -acetaminophen  (PERCOCET/ROXICET) 5-325 MG per tablet 2 tablet  2 tablet Oral BID Parker, Alvin S, MD   2 tablet at 09/14/23 1811   pantoprazole  (PROTONIX ) EC tablet 40 mg  40 mg Oral BID Layla Kesling, MD   40 mg at 09/14/23 1627   traZODone (DESYREL) tablet 50 mg  50 mg Oral QHS PRN Costella Dirks, NP        Lab Results: No results found for this or any previous visit (from the past 48 hours).  Blood Alcohol level:  Lab Results  Component Value Date   ETH <10 12/29/2022   [facility]  05/31/2008    <5        LOWEST DETECTABLE LIMIT FOR SERUM ALCOHOL IS 5 mg/dL FOR MEDICAL PURPOSES ONLY    Metabolic Labs: No results found for: "HGBA1C", "MPG" No results found for: "PROLACTIN" Lab Results  Component Value Date   CHOL  05/31/2008    107        ATP III CLASSIFICATION:  <200     mg/dL   Desirable  782-956  mg/dL   Borderline High  >=213    mg/dL   High          TRIG 086 05/31/2008   HDL 31 (L) 05/31/2008   CHOLHDL 3.5 05/31/2008   VLDL 22 05/31/2008   LDLCALC  05/31/2008    54        Total Cholesterol/HDL:CHD Risk Coronary Heart Disease Risk Table                     Men   Women  1/2 Average Risk   3.4   3.3  Average Risk       5.0   4.4  2 X Average Risk   9.6    7.1  3 X Average Risk  23.4   11.0        Use the calculated Patient Ratio above and the CHD Risk Table to determine the patient's CHD Risk.        ATP III CLASSIFICATION (LDL):  <100     mg/dL   Optimal  578-469  mg/dL   Near or Above                    Optimal  130-159  mg/dL   Borderline  629-528  mg/dL   High  >413     mg/dL   Very High    Sleep:Sleep: Fair   Physical Findings: AIMS: No  CIWA:    COWS:     Psychiatric Specialty Exam:  Presentation  General Appearance: Appropriate for Environment  Eye Contact:Fair  Speech:Clear and Coherent  Speech Volume:Normal  Mood and Affect  Mood:"feel back to self"  Affect:Congruent   Thought Process  Thought Processes:Coherent; Goal Directed  Descriptions of Associations:Intact  Orientation:Full (Time, Place and Person)  Thought Content:Logical  History of Schizophrenia/Schizoaffective disorder: None Duration of Psychotic Symptoms:N/A Hallucinations:Hallucinations: None  Ideas of Reference:None  Suicidal Thoughts:Suicidal Thoughts: No  Homicidal Thoughts:Homicidal Thoughts: No   Sensorium  Memory:Immediate Fair  Judgment:Intact  Insight:Present   Executive Functions  Concentration:Fair  Attention Span:Fair  Recall:Fair  Fund of Knowledge:Fair  Language:Fair   Psychomotor Activity  Psychomotor Activity:Psychomotor Activity: Normal   Assets  Assets:Communication Skills; Desire for Improvement; Financial Resources/Insurance; Housing; Intimacy; Resilience   Sleep  Sleep:Sleep: Fair   Physical Exam  ROS  Physical Exam Constitutional:      Appearance: the patient is not toxic-appearing.  Pulmonary:     Effort: Pulmonary effort is normal.  Neurological:     General: No focal deficit present.     Mental Status: the patient is alert and oriented to person, place, and time.   Review of Systems  Respiratory:  Negative for shortness of breath.   Cardiovascular:  Negative for  chest pain.  Gastrointestinal:  Negative for abdominal pain, constipation, diarrhea, nausea and vomiting.  Neurological:  Negative for headaches.   Blood pressure (!) 134/94, pulse 68, temperature 97.8 F (36.6 C), temperature source Oral, resp. rate 20, height 6\' 1"  (1.854 m), weight 98.9 kg, SpO2 99%. Body mass index is 28.76 kg/m.  Treatment Plan Summary: Daily contact with patient to assess and evaluate symptoms and progress in treatment, Medication management, and Plan    ASSESSMENT: [name] is a 53 y.o. male  with a past psychiatric history of none. Patient initially arrived to [facility] on May 9 after drinking 1 styrofoam cup of Clorox cleaner and feeling his chest burning and shortness of breath.  He was admitted to [facility] had an EGD on May 10 and was discharged on May 13 and admitted to [facility] under IVC on May 14 for acute safety concerns and acute suicidal or self-harming behaviors. PPHx is significant for none, and no history of Suicide Attempts, Self Injurious Behavior, or Prior Psychiatric Hospitalizations. PMHx is significant for chronic pain, HTN, prediabetes, multiple back surgeries  Patient continues to minimize depressive symptoms prior to arrival though acknowledges he may have been feeling depressed. Also seems to have limited insight into his opioid use. He has slightly elevated BP so will hold off on increasing cymbalta until tomorrow. His labs are largely wnl.   Diagnoses / Active Problems: Substance-induced mood disorder  Opioid use disorder  PLAN: Safety and Monitoring:  -- INVOLUNTARY admission to inpatient psychiatric unit for safety, stabilization and treatment  -- Daily contact with patient to assess and evaluate symptoms and progress in treatment  -- Patient's case to be discussed in multi-disciplinary team meeting  -- Observation Level : q15 minute checks  -- Vital signs:  q12 hours  -- Precautions: suicide, elopement, and  assault  2. Psychiatric Diagnoses and Treatment:  --Continue cymbalta DR 30mg  for depressed mood, chronic pain with plan to increase to 60mg  tomorrow        -- The risks/benefits/side-effects/alternatives to this medication were discussed in detail with the patient and time was given for questions. The patient consents to medication trial.              -- Metabolic profile and EKG monitoring obtained while on an atypical antipsychotic  BMI: Body mass index is 28.76 kg/m. TSH: No results found for: "TSH" Lipid Panel: pending HbgA1c: pending QTc: pending             -- Encouraged patient to participate in unit milieu and in scheduled group therapies   Ability to identify changes in lifestyle to reduce recurrence of condition will improve, Ability to verbalize feelings will improve, Ability to disclose and discuss suicidal ideas, Ability to demonstrate self-control will improve, Ability to identify and develop effective coping behaviors will improve, Ability to maintain clinical measurements within normal limits will improve, Compliance with prescribed medications will improve, and Ability to identify triggers associated with substance abuse/mental health issues will improve            --  Long Term Goals: Improvement in symptoms so as ready for discharge  Other PRNS:  acetaminophen , alum & mag hydroxide-simeth, haloperidol **AND** diphenhydrAMINE, haloperidol lactate **AND** diphenhydrAMINE **AND** LORazepam, haloperidol lactate **AND** diphenhydrAMINE **AND** LORazepam, hydrOXYzine, magnesium hydroxide, traZODone  -- As needed agitation protocol in-place   3. Medical Issues Being Addressed:   #Chronic pain --restarted home percocet, robaxin    #HTN Restarted home amlodipine    #GERD, recent bleach ingestion --continue protonix  40mg  BID    #Prediabetes --continue home metformin  4. Discharge Planning:   -- Social work and case management to assist with discharge planning and  identification of hospital follow-up needs prior to discharge  -- Estimated LOS: 5-7 days  -- Discharge Concerns: Need to establish a safety plan; Medication compliance and effectiveness  -- Discharge Goals: Return home with outpatient referrals for mental health follow-up including medication management/psychotherapy   I certify that inpatient services furnished can reasonably be expected to improve the patient's condition.   This note was created using a voice recognition software as a result there may be grammatical errors inadvertently enclosed that do not reflect the nature of this encounter. Every attempt is made to correct such errors.   This case was discussed with attending Dr. Daneil Dunker who agrees with the above formulated treatment plan. Please see attending attestation for additional details.   Dr. Norbert Bean, MD PGY-2, Psychiatry Residency  5/15/20256:38 AM

## 2023-09-15 NOTE — BH Assessment (Signed)
 Patient was up at beginning of shift. Denies SI/HI, and AVH. Pt states that what he did was a once in a lifetime mistake and he will not do it again. Pleasant and cooperative. 15 min checks maintained

## 2023-09-15 NOTE — Plan of Care (Signed)
   Problem: Education: Goal: Emotional status will improve Outcome: Progressing Goal: Mental status will improve Outcome: Progressing

## 2023-09-15 NOTE — [facility] Suicide Risk Assessment (Signed)
 [facility] INPATIENT:  Family/Significant Other Suicide Prevention Education  Suicide Prevention Education:  Education Completed; Adrial Ty (wife) [phone],  (name of family member/significant other) has been identified by the patient as the family member/significant other with whom the patient will be residing, and identified as the person(s) who will aid the patient in the event of a mental health crisis (suicidal ideations/suicide attempt).  With written consent from the patient, the family member/significant other has been provided the following suicide prevention education, prior to the and/or following the discharge of the patient.  Aurora Blowers confirmed patient will not have access to firearms/guns/weapons once discharged. Aurora Blowers confirmed she will be able to assist patient in safely storing and securing his medications. Aurora Blowers confirmed no safety concerns pertaining to patient being discharged home once stable.  The suicide prevention education provided includes the following: Suicide risk factors Suicide prevention and interventions National Suicide Hotline telephone number [facility] assessment telephone number [facility] Emergency Assistance 911 [facility] and/or Residential Mobile Crisis Unit telephone number  Request made of family/significant other to: Remove weapons (e.g., guns, rifles, knives), all items previously/currently identified as safety concern.   Remove drugs/medications (over-the-counter, prescriptions, illicit drugs), all items previously/currently identified as a safety concern.  The family member/significant other verbalizes understanding of the suicide prevention education information provided.  The family member/significant other agrees to remove the items of safety concern listed above.  Johnny Gorter M Ramah Langhans, LCSWA 09/15/2023, 1:25 PM

## 2023-09-15 NOTE — Progress Notes (Addendum)
 D. Pt has been friendly, pleasant during interactions, visible in the milieu, observed interacting well with peers and staff- observed attending group.Pt currently denies SI/HI and AVH Per pt's self inventory, pt rated his depression,hopelessness and anxiety all 0's. Pt's stated goal is to work on "communication and staying focused." A. Labs and vitals monitored. Pt given and educated on medications. Pt supported emotionally and encouraged to express concerns and ask questions.   R. Pt remains safe with 15 minute checks. Will continue POC.    09/15/23 1000  Psych Admission Type (Psych Patients Only)  Admission Status Voluntary  Psychosocial Assessment  Patient Complaints None  Eye Contact Fair  Facial Expression Animated  Affect Appropriate to circumstance  Speech Logical/coherent  Interaction Assertive  Motor Activity Slow  Appearance/Hygiene Unremarkable  Behavior Characteristics Cooperative  Mood Sad  Thought Process  Coherency WDL  Content WDL  Delusions None reported or observed  Perception WDL  Hallucination None reported or observed  Judgment Impaired  Confusion None  Danger to Self  Current suicidal ideation? Denies  Danger to Others  Danger to Others None reported or observed

## 2023-09-15 NOTE — Group Note (Unsigned)
 Date:  09/16/2023 Time:  2:09 AM  Group Topic/Focus:  Wrap-Up Group:   The focus of this group is to help patients review their daily goal of treatment and discuss progress on daily workbooks.    Participation Level:  Minimal  Participation Quality:  Appropriate  Affect:  Appropriate  Cognitive:  Appropriate  Insight: None  Engagement in Group:  Engaged  Modes of Intervention:  Activity and Socialization  Additional Comments:  Patient attended wrap up group and participated in activity but did not share.   Dillard Frame 09/16/2023, 2:09 AM

## 2023-09-15 NOTE — Group Note (Signed)
 LCSW Group Therapy Note   Group Date: 09/15/2023 Start Time: 1100 End Time: 1200     Participation:  patient was present   Type of Therapy:  Group Therapy   Topic:  Understanding your Path to Change      Objective:  The goal is to help individuals understand the stages of change, identify where they currently are in the process, and provide actionable next steps to continue moving forward in their journey of change.   Goals: - Learn about the six stages of change:  Precontemplation, Contemplation, Preparation, Action, Maintenance, and Relapse - Reflect on Current Change Efforts:  Recognize which stage participants are in regarding a personal change. - Plan Next Steps for Moving Forward:  Create an action plan based on their current stage of change.   Class Summary: In this session, we explored the Stages of Change as a framework to understand the process of change.  We discussed how each stage helps individuals recognize where they are in their personal journey and used the Stages of Change Worksheet for self-reflection. Participants answered questions to better understand their current stage, challenges, and progress. We also emphasized the importance of moving forward, even if setbacks (Relapse) occur, and created actionable steps to help participants continue progressing. By the end of the session, participants gained a clearer understanding of their path to change and left with a clear plan for next steps.   Modalities:  Elements of CBT (cognitive restructuring, problem solving)  Element of DBT (mindfulness, distress tolerance)   Jamari Diana O Yovanny Coats, LCSWA 09/15/2023  2:54 PM

## 2023-09-16 MED ORDER — DULOXETINE HCL 30 MG PO CPEP
30.0000 mg | ORAL_CAPSULE | Freq: Every day | ORAL | Status: AC
  Administered 2023-09-16: 30 mg via ORAL
  Filled 2023-09-16: qty 1

## 2023-09-16 MED ORDER — DULOXETINE HCL 60 MG PO CPEP
60.0000 mg | ORAL_CAPSULE | Freq: Every day | ORAL | Status: DC
Start: 2023-09-17 — End: 2023-09-18
  Administered 2023-09-17: 60 mg via ORAL
  Filled 2023-09-16 (×3): qty 1

## 2023-09-16 MED ORDER — LISINOPRIL 5 MG PO TABS
5.0000 mg | ORAL_TABLET | Freq: Every day | ORAL | Status: DC
  Administered 2023-09-16 – 2023-09-17 (×2): 5 mg via ORAL
  Filled 2023-09-16 (×4): qty 1

## 2023-09-16 MED ORDER — DULOXETINE HCL 60 MG PO CPEP
60.0000 mg | ORAL_CAPSULE | Freq: Every day | ORAL | Status: DC
Start: 2023-09-16 — End: 2023-09-16
  Filled 2023-09-16: qty 1

## 2023-09-16 NOTE — Progress Notes (Signed)
 Adult Psychoeducational Group Note  Date:  09/16/2023 Time:  8:08 PM  Group Topic/Focus:  Wrap-Up Group:   The focus of this group is to help patients review their daily goal of treatment and discuss progress on daily workbooks.  Participation Level:  Active  Participation Quality:  Appropriate  Affect:  Appropriate  Cognitive:  Appropriate  Insight: Appropriate  Engagement in Group:  Engaged  Modes of Intervention:  Discussion  Additional Comments:  Attend AA meeting  [name] 09/16/2023, 8:08 PM

## 2023-09-16 NOTE — Progress Notes (Signed)
 [facility] MD Progress Note  09/16/2023 10:23 AM [name]  MRN:  [id number]  Principal Problem: MDD (major depressive disorder), severe (HCC) Diagnosis: Principal Problem:   MDD (major depressive disorder), severe (HCC)  Reason for Admission:  [name] is a 53 y.o. male  with a past psychiatric history of none. Patient initially arrived to [facility] on May 9 after drinking 1 styrofoam cup of Clorox cleaner and feeling his chest burning and shortness of breath.  He was admitted to [facility] had an EGD on May 10 and was discharged on May 13 and admitted to [facility] under IVC on May 14 for acute safety concerns and acute suicidal or self-harming behaviors. PPHx is significant for none, and no history of Suicide Attempts, Self Injurious Behavior, or Prior Psychiatric Hospitalizations. PMHx is significant for chronic pain, HTN, prediabetes, multiple back surgeries  (admitted on 09/13/2023, total  LOS: 3 days )  Chart review: Overnight Events Vital signs: elevated BP 141/102 MAR was reviewed and patient was compliant with medications.  Patient received no PRN  Sleep:  Nursing notes /groups: attended all groups.  Pertinent information discussed during bed progression:  Case was discussed in the multidisciplinary team. Nursing reports patient slept 6 hours, elevated BP. She denies SI/HI/AVH.   Information Obtained Today During Patient Interview: Patient reports no issues with sleep or appetite. We discussed elevated blood pressure and he reports he did not have elevated blood pressure in the past, reports he talked with PCP 3-4 months ago. We discussed starting lisinopril and to follow-up with his PCP regarding his blood pressure. He reports some somnolence as a side effect from cymbalta. Discussed that we will give him cymbalta at nighttime and will be increased today. He reports he feels like this admission was an eye opener for him. He also states that he plans to talk with his  PCP regarding chronic pain management and that he plans to look into NA groups after discharge. He denies SI/HI/AVH. He reports that he talked with his family and it went well.   Past Psychiatric Hx: Current Psychiatrist: Denies Current Therapist: Denies Previous Psychiatric Diagnoses: Denies Current psychiatric medications: Denies Psychiatric medication history/compliance: Denies Psychiatric Hospitalization hx: Denies Psychotherapy hx: Denies Neuromodulation history: Denies History of suicide (obtained from HPI): Denies History of homicide or aggression (obtained in HPI): Denies   Substance Abuse Hx: (Frequency, quantity, last use, impact) Alcohol:  denies Tobacco: denies Cannabis: denies Other Illicit drugs: buying extra percocet  Rx drug abuse: buying extra percocet Rehab hx: denies   Past Medical History: PCP: Dentist Dx: DJD, GERD, HTN, dyslipidemia, prediabetes Medications: metformin, amlodipine, hydrochlorothiazide PRN, robaxin , percocet, protonix  Allergies: vancomycin  Surgeries: S/p revision L3-S1 posterior spinal fusion 08/2016 Trauma: denies Seizures: denies   Family Medical History:      Family History  Problem Relation Age of Onset   Stomach cancer Father     Diabetes Father     Kidney disease Father     Hypertension Father     Esophageal cancer Neg Hx     Rectal cancer Neg Hx     Colon cancer Neg Hx            Family Psychiatric History: Psychiatric Dx: denies Suicide Hx: denies Violence/Aggression: denies Substance use: denies   Social History: He reports that he has been on disability since 2013 for his back issues.  He reports that he lives with his wife and stepdaughter who is 41 and temporarily living  with him.  He denies any legal history.  He denies any military history.  He denies any access to firearms.  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  He reports that he has been married to his wife for  29 years.  He gives permission to contact his wife [name] for additional information.   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   Prediabetes    Family History:  Family History  Problem Relation Age of Onset   Stomach cancer Father    Diabetes Father    Kidney disease Father    Hypertension Father    Esophageal cancer Neg Hx    Rectal cancer Neg Hx    Colon cancer Neg Hx     Current Medications: Current Facility-Administered Medications  Medication Dose Route Frequency Provider Last Rate Last Admin   acetaminophen  (TYLENOL ) tablet 650 mg  650 mg Oral Q6H PRN Coleman, Carolyn H, NP       alum & mag hydroxide-simeth (MAALOX/MYLANTA) 200-200-20 MG/5ML suspension 30 mL  30 mL Oral Q4H PRN Costella Dirks, NP       amLODipine (NORVASC) tablet 10 mg  10 mg Oral Daily Ayanna Gheen, MD   10 mg at 09/16/23 0856   haloperidol (HALDOL) tablet 5 mg  5 mg Oral TID PRN Costella Dirks, NP       And   diphenhydrAMINE (BENADRYL) capsule 50 mg  50 mg Oral TID PRN Costella Dirks, NP       haloperidol lactate (HALDOL) injection 5 mg  5 mg Intramuscular TID PRN Costella Dirks, NP       And   diphenhydrAMINE (BENADRYL) injection 50 mg  50 mg Intramuscular TID PRN Coleman, Carolyn H, NP       And   LORazepam (ATIVAN) injection 2 mg  2 mg Intramuscular TID PRN Costella Dirks, NP       haloperidol lactate (HALDOL) injection 10 mg  10 mg Intramuscular TID PRN Costella Dirks, NP       And   diphenhydrAMINE (BENADRYL) injection 50 mg  50 mg Intramuscular  TID PRN Coleman, Carolyn H, NP       And   LORazepam (ATIVAN) injection 2 mg  2 mg Intramuscular TID PRN Costella Dirks, NP       DULoxetine (CYMBALTA) DR capsule 30 mg  30 mg Oral QHS Aspen Lawrance, MD       Followed by   Cecily Cohen ON 09/17/2023] DULoxetine (CYMBALTA) DR capsule 60 mg  60 mg Oral QHS Avontae Burkhead, MD       hydrOXYzine (ATARAX) tablet 25 mg  25 mg Oral TID PRN Costella Dirks, NP       lisinopril (ZESTRIL) tablet 5 mg  5 mg Oral Daily Aleph Nickson, MD       magnesium hydroxide (MILK OF MAGNESIA) suspension 30 mL  30 mL Oral Daily PRN Costella Dirks, NP  metFORMIN (GLUCOPHAGE-XR) 24 hr tablet 500 mg  500 mg Oral Daily Kristi Hyer, MD   500 mg at 09/16/23 0856   methocarbamol  (ROBAXIN ) tablet 750 mg  750 mg Oral TID Parker, Alvin S, MD   750 mg at 09/16/23 1610   oxyCODONE -acetaminophen  (PERCOCET/ROXICET) 5-325 MG per tablet 2 tablet  2 tablet Oral BID Timmothy Foots, MD   2 tablet at 09/16/23 0856   pantoprazole  (PROTONIX ) EC tablet 40 mg  40 mg Oral BID Faryn Sieg, MD   40 mg at 09/16/23 0856   traZODone (DESYREL) tablet 50 mg  50 mg Oral QHS PRN Costella Dirks, NP        Lab Results:  Results for orders placed or performed during the hospital encounter of 09/13/23 (from the past 48 hours)  CBC with Differential/Platelet     Status: Abnormal   Collection Time: 09/15/23  6:33 AM  Result Value Ref Range   WBC 5.2 4.0 - 10.5 K/uL   RBC 4.90 4.22 - 5.81 MIL/uL   Hemoglobin 15.0 13.0 - 17.0 g/dL   HCT 96.0 45.4 - 09.8 %   MCV 95.5 80.0 - 100.0 fL   MCH 30.6 26.0 - 34.0 pg   MCHC 32.1 30.0 - 36.0 g/dL   RDW 11.9 14.7 - 82.9 %   Platelets 435 (H) 150 - 400 K/uL   nRBC 0.0 0.0 - 0.2 %   Neutrophils Relative % 57 %   Neutro Abs 3.0 1.7 - 7.7 K/uL   Lymphocytes Relative 32 %   Lymphs Abs 1.7 0.7 - 4.0 K/uL   Monocytes Relative 8 %   Monocytes Absolute 0.4 0.1 - 1.0 K/uL   Eosinophils Relative 3 %   Eosinophils Absolute 0.1 0.0 - 0.5  K/uL   Basophils Relative 0 %   Basophils Absolute 0.0 0.0 - 0.1 K/uL   Immature Granulocytes 0 %   Abs Immature Granulocytes 0.02 0.00 - 0.07 K/uL    Comment: Performed at [facility], [address], [place], [place] [address]  Comprehensive metabolic panel     Status: Abnormal   Collection Time: 09/15/23  6:33 AM  Result Value Ref Range   Sodium 137 135 - 145 mmol/L   Potassium 3.4 (L) 3.5 - 5.1 mmol/L   Chloride 101 98 - 111 mmol/L   CO2 27 22 - 32 mmol/L   Glucose, Bld 113 (H) 70 - 99 mg/dL    Comment: Glucose reference range applies only to samples taken after fasting for at least 8 hours.   BUN 8 6 - 20 mg/dL   Creatinine, Ser 0.86 0.61 - 1.24 mg/dL   Calcium 9.2 8.9 - 57.8 mg/dL   Total Protein 8.2 (H) 6.5 - 8.1 g/dL   Albumin 4.4 3.5 - 5.0 g/dL   AST 31 15 - 41 U/L   ALT 46 (H) 0 - 44 U/L   Alkaline Phosphatase 62 38 - 126 U/L   Total Bilirubin 1.3 (H) 0.0 - 1.2 mg/dL   GFR, Estimated >46 >96 mL/min    Comment: (NOTE) Calculated using the CKD-EPI Creatinine Equation (2021)    Anion gap 9 5 - 15    Comment: Performed at [facility], [address], [place], [place] [address]  Folate     Status: None   Collection Time: 09/15/23  6:33 AM  Result Value Ref Range   Folate 9.6 >5.9 ng/mL    Comment: Performed at [facility], [address], [place], [place] [address]  RPR     Status: None   Collection Time: 09/15/23  6:33 AM  Result Value Ref Range   RPR Ser Ql NON REACTIVE NON REACTIVE    Comment: Performed at [facility], [address] [place], [place] [address]  TSH     Status: None   Collection Time: 09/15/23  6:33 AM  Result Value Ref Range   TSH 1.320 0.350 - 4.500 uIU/mL    Comment: Performed by a 3rd Generation assay with a functional sensitivity of <=0.01 uIU/mL. Performed at [facility], [address] [place], [place] [address]   Vitamin B12     Status: None    Collection Time: 09/15/23  6:33 AM  Result Value Ref Range   Vitamin B-12 370 180 - 914 pg/mL    Comment: (NOTE) This assay is not validated for testing neonatal or myeloproliferative syndrome specimens for Vitamin B12 levels. Performed at [facility], [address] [place], [place] [address]   VITAMIN D 25 Hydroxy (Vit-D Deficiency, Fractures)     Status: None   Collection Time: 09/15/23  6:33 AM  Result Value Ref Range   Vit D, 25-Hydroxy 45.48 30 - 100 ng/mL    Comment: (NOTE) Vitamin D deficiency has been defined by the Institute of Medicine  and an Endocrine Society practice guideline as a level of serum 25-OH  vitamin D less than 20 ng/mL (1,2). The Endocrine Society went on to  further define vitamin D insufficiency as a level between 21 and 29  ng/mL (2).  1. IOM (Institute of Medicine). 2010. Dietary reference intakes for  calcium and D. [place]: [employer]. 2. Holick MF, Binkley Imboden, Bischoff-Ferrari HA, et al. Evaluation,  treatment, and prevention of vitamin D deficiency: an Endocrine  Society clinical practice guideline, JCEM. 2011 Jul; 96(7): 1911-30.  Performed at [facility], [address], [place], [place] [address]   Lipid panel     Status: None   Collection Time: 09/15/23  6:33 AM  Result Value Ref Range   Cholesterol 140 0 - 200 mg/dL   Triglycerides 79 <295 mg/dL   HDL 53 >28 mg/dL   Total CHOL/HDL Ratio 2.6 RATIO   VLDL 16 0 - 40 mg/dL   LDL Cholesterol 71 0 - 99 mg/dL    Comment:        Total Cholesterol/HDL:CHD Risk Coronary Heart Disease Risk Table                     Men   Women  1/2 Average Risk   3.4   3.3  Average Risk       5.0   4.4  2 X Average Risk   9.6   7.1  3 X Average Risk  23.4   11.0        Use the calculated Patient Ratio above and the CHD Risk Table to determine the patient's CHD Risk.        ATP III CLASSIFICATION (LDL):  <100     mg/dL   Optimal  413-244  mg/dL   Near  or Above                    Optimal  130-159  mg/dL   Borderline  010-272  mg/dL   High  >536     mg/dL   Very High Performed at [facility],  [address] [place], [place] [address]   Hemoglobin A1c     Status: None   Collection Time: 09/15/23  6:33 AM  Result Value Ref Range   Hgb A1c MFr Bld 4.9 4.8 - 5.6 %    Comment: (NOTE) Pre diabetes:          5.7%-6.4%  Diabetes:              >6.4%  Glycemic control for   <7.0% adults with diabetes    Mean Plasma Glucose 93.93 mg/dL    Comment: Performed at [facility], [address], [place], [place] [address]    Blood Alcohol level:  Lab Results  Component Value Date   ETH <10 12/29/2022   [facility]  05/31/2008    <5        LOWEST DETECTABLE LIMIT FOR SERUM ALCOHOL IS 5 mg/dL FOR MEDICAL PURPOSES ONLY    Metabolic Labs: Lab Results  Component Value Date   HGBA1C 4.9 09/15/2023   MPG 93.93 09/15/2023   No results found for: "PROLACTIN" Lab Results  Component Value Date   CHOL 140 09/15/2023   TRIG 79 09/15/2023   HDL 53 09/15/2023   CHOLHDL 2.6 09/15/2023   VLDL 16 09/15/2023   LDLCALC 71 09/15/2023   LDLCALC  05/31/2008    54        Total Cholesterol/HDL:CHD Risk Coronary Heart Disease Risk Table                     Men   Women  1/2 Average Risk   3.4   3.3  Average Risk       5.0   4.4  2 X Average Risk   9.6   7.1  3 X Average Risk  23.4   11.0        Use the calculated Patient Ratio above and the CHD Risk Table to determine the patient's CHD Risk.        ATP III CLASSIFICATION (LDL):  <100     mg/dL   Optimal  829-562  mg/dL   Near or Above                    Optimal  130-159  mg/dL   Borderline  130-865  mg/dL   High  >784     mg/dL   Very High    Sleep: 6 hours   Physical Findings: AIMS: No  CIWA:    COWS:     Psychiatric Specialty Exam:  Presentation  General Appearance: Appropriate for Environment  Eye Contact:Fair  Speech:Clear and Coherent  Speech  Volume:Normal  Mood and Affect  Mood:"good"  Affect:Congruent   Thought Process  Thought Processes:Coherent; Goal Directed  Descriptions of Associations:Intact  Orientation:Full (Time, Place and Person)  Thought Content:Logical  History of Schizophrenia/Schizoaffective disorder: None Duration of Psychotic Symptoms:N/A Hallucinations: None  Ideas of Reference:None  Suicidal Thoughts: None  Homicidal Thoughts: None   Sensorium  Memory:Immediate Fair  Judgment:Intact  Insight:Present   Executive Functions  Concentration:Fair  Attention Span:Fair  Recall:Fair  Fund of Knowledge:Fair  Language:Fair   Psychomotor Activity  Psychomotor Activity: Normal   Assets  Assets:Communication Skills; Desire for Improvement; Financial Resources/Insurance; Housing; Intimacy; Resilience   Sleep  Sleep: 6 hours  Physical Exam ROS  Physical Exam Constitutional:      Appearance: the patient is not toxic-appearing.  Pulmonary:     Effort: Pulmonary effort is normal.  Neurological:  General: No focal deficit present.     Mental Status: the patient is alert and oriented to person, place, and time.   Review of Systems  Respiratory:  Negative for shortness of breath.   Cardiovascular:  Negative for chest pain.  Gastrointestinal:  Negative for abdominal pain, constipation, diarrhea, nausea and vomiting.  Neurological:  Negative for headaches.   Blood pressure (!) 141/102, pulse 83, temperature 97.9 F (36.6 C), temperature source Oral, resp. rate 16, height 6\' 1"  (1.854 m), weight 98.9 kg, SpO2 100%. Body mass index is 28.76 kg/m.  Treatment Plan Summary: Daily contact with patient to assess and evaluate symptoms and progress in treatment, Medication management, and Plan    ASSESSMENT: [name] is a 53 y.o. male  with a past psychiatric history of none. Patient initially arrived to [facility] on May 9 after drinking 1 styrofoam cup of Clorox  cleaner and feeling his chest burning and shortness of breath.  He was admitted to [facility] had an EGD on May 10 and was discharged on May 13 and admitted to [facility] under IVC on May 14 for acute safety concerns and acute suicidal or self-harming behaviors. PPHx is significant for none, and no history of Suicide Attempts, Self Injurious Behavior, or Prior Psychiatric Hospitalizations. PMHx is significant for chronic pain, HTN, prediabetes, multiple back surgeries  Patient continues to minimize depressive symptoms prior to arrival though acknowledges he may have been feeling depressed. Seems to have improving insight and looking into NA groups. He has slightly elevated BP, will start lisinopril for his blood pressure  Diagnoses / Active Problems: Substance-induced mood disorder  Opioid use disorder  PLAN: Safety and Monitoring:  -- INVOLUNTARY admission to inpatient psychiatric unit for safety, stabilization and treatment  -- Daily contact with patient to assess and evaluate symptoms and progress in treatment  -- Patient's case to be discussed in multi-disciplinary team meeting  -- Observation Level : q15 minute checks  -- Vital signs:  q12 hours  -- Precautions: suicide, elopement, and assault  2. Psychiatric Diagnoses and Treatment:  --Increase cymbalta DR 60mg  for depressed mood  -- The risks/benefits/side-effects/alternatives to this medication were discussed in detail with the patient and time was given for questions. The patient consents to medication trial.              -- Metabolic profile and EKG monitoring obtained while on an atypical antipsychotic  BMI: Body mass index is 28.76 kg/m. TSH:  Lab Results  Component Value Date   TSH 1.320 09/15/2023   Lipid Panel:  Lab Results  Component Value Date   CHOL 140 09/15/2023   HDL 53 09/15/2023   LDLCALC 71 09/15/2023   TRIG 79 09/15/2023   CHOLHDL 2.6 09/15/2023   HbgA1c:  Lab Results  Component Value Date   HGBA1C  4.9 09/15/2023    QTc: 475 (5/15)             -- Encouraged patient to participate in unit milieu and in scheduled group therapies   Ability to identify changes in lifestyle to reduce recurrence of condition will improve, Ability to verbalize feelings will improve, Ability to disclose and discuss suicidal ideas, Ability to demonstrate self-control will improve, Ability to identify and develop effective coping behaviors will improve, Ability to maintain clinical measurements within normal limits will improve, Compliance with prescribed medications will improve, and Ability to identify triggers associated with substance abuse/mental health issues will improve            --  Long Term Goals: Improvement in symptoms so as ready for discharge  Other PRNS:  acetaminophen , alum & mag hydroxide-simeth, haloperidol **AND** diphenhydrAMINE, haloperidol lactate **AND** diphenhydrAMINE **AND** LORazepam, haloperidol lactate **AND** diphenhydrAMINE **AND** LORazepam, hydrOXYzine, magnesium hydroxide, traZODone  -- As needed agitation protocol in-place   3. Medical Issues Being Addressed:   #Chronic pain --restarted home percocet, robaxin    #HTN Restarted home amlodipine  Start lisinopril 5mg    #GERD, recent bleach ingestion --continue protonix  40mg  BID    #Prediabetes --continue home metformin  4. Discharge Planning:   -- Social work and case management to assist with discharge planning and identification of hospital follow-up needs prior to discharge  -- Estimated LOS: 5/18  -- Discharge Concerns: Need to establish a safety plan; Medication compliance and effectiveness  -- Discharge Goals: Return home with outpatient referrals for mental health follow-up including medication management/psychotherapy   I certify that inpatient services furnished can reasonably be expected to improve the patient's condition.   This note was created using a voice recognition software as a result there may be  grammatical errors inadvertently enclosed that do not reflect the nature of this encounter. Every attempt is made to correct such errors.   This case was discussed with attending Dr. Daneil Dunker who agrees with the above formulated treatment plan. Please see attending attestation for additional details.   Dr. Norbert Bean, MD PGY-2, Psychiatry Residency  5/16/202510:23 AM

## 2023-09-16 NOTE — Progress Notes (Signed)
   09/16/23 0940  Psych Admission Type (Psych Patients Only)  Admission Status Voluntary  Psychosocial Assessment  Patient Complaints None  Eye Contact Fair  Facial Expression Animated  Affect Appropriate to circumstance  Speech Logical/coherent  Interaction Assertive  Motor Activity Slow  Appearance/Hygiene In scrubs  Behavior Characteristics Cooperative;Calm  Mood Pleasant  Thought Process  Coherency WDL  Content WDL  Delusions None reported or observed  Perception WDL  Hallucination None reported or observed  Judgment Impaired  Confusion None  Danger to Self  Current suicidal ideation? Denies  Danger to Others  Danger to Others None reported or observed

## 2023-09-16 NOTE — Group Note (Signed)
 Recreation Therapy Group Note   Group Topic:Problem Solving  Group Date: 09/16/2023 Start Time: 0935 End Time: 0958 Facilitators: Froylan Hobby-McCall, LRT,CTRS Location: [address] Day[address]   Group Topic: Communication, Team Building, Problem Solving  Goal Area(s) Addresses:  Patient will effectively work with peer towards shared goal.  Patient will identify skills used to make activity successful.  Patient will identify how skills used during activity can be used to reach post d/c goals.   Intervention: STEM Activity  Activity: Stage manager. In teams of 3-5, patients were given 12 plastic drinking straws and an equal length of masking tape. Using the materials provided, patients were asked to build a landing pad to catch a golf ball dropped from approximately 5 feet in the air. All materials were required to be used by the team in their design. LRT facilitated post-activity discussion.  Education: Pharmacist, community, Scientist, physiological, Discharge Planning   Education Outcome: Acknowledges education/In group clarification offered/Needs additional education.    Affect/Mood: N/A   Participation Level: Did not attend    Clinical Observations/Individualized Feedback:     Plan: Continue to engage patient in RT group sessions 2-3x/week.   Owin Vignola-McCall, LRT,CTRS 09/16/2023 12:25 PM

## 2023-09-16 NOTE — [facility] Group Notes (Signed)
 The focus of this group is to help patients establish daily goals to achieve during treatment and discuss how the patient can incorporate goal setting into their daily lives to aide in recovery.     Scale 1-10:  10   Goal of the day: Have a great day

## 2023-09-16 NOTE — Plan of Care (Signed)
  Problem: Education: Goal: Emotional status will improve Outcome: Progressing Goal: Mental status will improve Outcome: Progressing Goal: Verbalization of understanding the information provided will improve Outcome: Progressing   Problem: Safety: Goal: Periods of time without injury will increase Outcome: Progressing   

## 2023-09-16 NOTE — Progress Notes (Signed)
   09/16/23 2140  Psych Admission Type (Psych Patients Only)  Admission Status Voluntary  Psychosocial Assessment  Patient Complaints None  Eye Contact Fair  Facial Expression Animated  Affect Appropriate to circumstance  Speech Logical/coherent  Interaction Assertive  Motor Activity Other (Comment) (WDL)  Appearance/Hygiene In scrubs  Mood Pleasant  Thought Process  Coherency WDL  Content WDL  Delusions None reported or observed  Perception WDL  Hallucination None reported or observed  Judgment Impaired  Confusion None  Danger to Self  Current suicidal ideation? Denies  Danger to Others  Danger to Others None reported or observed

## 2023-09-16 NOTE — Plan of Care (Signed)
 Collateral Call  Patient's wife, Wylder Trine, was contacted due to requesting an update.  Provided update to patient's wife regarding plan including plan for discharge on Sunday.   Patient's wife expressed appreciation for the call.   Rashon Rezek, MD PGY-2

## 2023-09-17 MED ORDER — LISINOPRIL 10 MG PO TABS: 10.0000 mg | ORAL_TABLET | Freq: Every day | ORAL | Status: DC

## 2023-09-17 MED ORDER — LISINOPRIL 5 MG PO TABS
5.0000 mg | ORAL_TABLET | Freq: Once | ORAL | Status: AC
  Administered 2023-09-17: 5 mg via ORAL
  Filled 2023-09-17: qty 1

## 2023-09-17 MED ORDER — LISINOPRIL 20 MG PO TABS
20.0000 mg | ORAL_TABLET | Freq: Every day | ORAL | Status: DC
  Administered 2023-09-18: 20 mg via ORAL
  Filled 2023-09-17 (×4): qty 1

## 2023-09-17 NOTE — Progress Notes (Addendum)
 [facility] MD Progress Note  09/17/2023 10:18 AM [name]  MRN:  [id number]  Principal Problem: MDD (major depressive disorder), severe (HCC) Diagnosis: Principal Problem:   MDD (major depressive disorder), severe (HCC)  Reason for Admission:  [name] is a 53 y.o. male  with a past psychiatric history of none. Patient initially arrived to [facility] on May 9 after drinking 1 styrofoam cup of Clorox cleaner and feeling his chest burning and shortness of breath.  He was admitted to [facility] had an EGD on May 10 and was discharged on May 13 and admitted to [facility] under IVC on May 14 for acute safety concerns and acute suicidal or self-harming behaviors. PPHx is significant for none, and no history of Suicide Attempts, Self Injurious Behavior, or Prior Psychiatric Hospitalizations. PMHx is significant for chronic pain, HTN, prediabetes, multiple back surgeries  (admitted on 09/13/2023, total  LOS: 4 days )  Chart review:  No acute events occurred overnight.  Patient was compliant with medications and visible in the milieu.  He is participating in groups.  Pertinent information discussed during bed progression:  Case was discussed in the multidisciplinary team. He denies SI/HI/AVH.   Information Obtained Today During Patient Interview: Patient was seen in the interview room during rounds.  He denied any issues with lisinopril  which was started at 5 mg yesterday.  Diastolic BP is slightly better today at 98 with a systolic of 145.  We discussed adding another 5 mg of lisinopril  today for total 10 mg today, and increasing to 20 mg tomorrow morning.  He reported that his mood was "okay" today.  He denies any new psychiatric or medical complaints.  He reports that his appetite is good.  He reports that focus and concentration are adequate.  He denies issues with energy.  He reports adequate sleep.  He denies any medication side effects.  He denies suicidal ideations, homicidal  ideations, auditory hallucinations, visual hallucinations, or delusions.  If he continues to improve, we will plan on discharging home tomorrow.  The patient uses [facility] on [address] in [place].  He states that he has adequate supplies of amlodipine , metformin , Robaxin , and pantoprazole .  He will need new prescriptions for duloxetine  60 mg daily, and for lisinopril  20 mg daily.   Past Psychiatric Hx: Current Psychiatrist: Denies Current Therapist: Denies Previous Psychiatric Diagnoses: Denies Current psychiatric medications: Denies Psychiatric medication history/compliance: Denies Psychiatric Hospitalization hx: Denies Psychotherapy hx: Denies Neuromodulation history: Denies History of suicide (obtained from HPI): Denies History of homicide or aggression (obtained in HPI): Denies   Substance Abuse Hx: (Frequency, quantity, last use, impact) Alcohol:  denies Tobacco: denies Cannabis: denies Other Illicit drugs: buying extra percocet  Rx drug abuse: buying extra percocet Rehab hx: denies   Past Medical History: PCP: Dentist Dx: DJD, GERD, HTN, dyslipidemia, prediabetes Medications: metformin , amlodipine , hydrochlorothiazide PRN, robaxin , percocet, protonix  Allergies: vancomycin  Surgeries: S/p revision L3-S1 posterior spinal fusion 08/2016 Trauma: denies Seizures: denies   Family Medical History:      Family History  Problem Relation Age of Onset   Stomach cancer Father     Diabetes Father     Kidney disease Father     Hypertension Father     Esophageal cancer Neg Hx     Rectal cancer Neg Hx     Colon cancer Neg Hx            Family Psychiatric History: Psychiatric Dx: denies Suicide Hx: denies  Violence/Aggression: denies Substance use: denies   Social History: He reports that he has been on disability since 2013 for his back issues.  He reports that he lives with his wife and stepdaughter who is 61 and temporarily  living with him.  He denies any legal history.  He denies any military history.  He denies any access to firearms.  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  He reports that he has been married to his wife for 29 years.  He gives permission to contact his wife Aurora Blowers for additional information.   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   Prediabetes    Family History:  Family History  Problem Relation Age of Onset   Stomach cancer Father    Diabetes Father    Kidney disease Father    Hypertension Father    Esophageal cancer Neg Hx    Rectal cancer Neg Hx    Colon cancer Neg Hx     Current Medications: Current Facility-Administered Medications  Medication Dose Route Frequency Provider Last Rate Last Admin   acetaminophen  (TYLENOL ) tablet 650 mg  650 mg Oral Q6H PRN Coleman, Carolyn H, NP       alum & mag hydroxide-simeth (MAALOX/MYLANTA) 200-200-20 MG/5ML suspension 30 mL  30 mL Oral Q4H PRN Coleman, Carolyn H, NP       amLODipine  (NORVASC ) tablet 10 mg  10 mg Oral Daily Chien, Stephanie, MD   10 mg at 09/17/23 3474   haloperidol  (HALDOL ) tablet 5 mg  5 mg Oral TID PRN Costella Dirks, NP       And   diphenhydrAMINE  (BENADRYL ) capsule 50 mg  50 mg Oral TID PRN Costella Dirks, NP       haloperidol  lactate (HALDOL ) injection 5 mg  5 mg Intramuscular TID PRN Costella Dirks, NP       And   diphenhydrAMINE  (BENADRYL ) injection 50 mg  50 mg Intramuscular TID PRN Costella Dirks, NP       And   LORazepam  (ATIVAN ) injection 2 mg  2 mg  Intramuscular TID PRN Costella Dirks, NP       haloperidol  lactate (HALDOL ) injection 10 mg  10 mg Intramuscular TID PRN Costella Dirks, NP       And   diphenhydrAMINE  (BENADRYL ) injection 50 mg  50 mg Intramuscular TID PRN Costella Dirks, NP       And   LORazepam  (ATIVAN ) injection 2 mg  2 mg Intramuscular TID PRN Coleman, Carolyn H, NP       DULoxetine  (CYMBALTA ) DR capsule 60 mg  60 mg Oral QHS Chien, Stephanie, MD       hydrOXYzine  (ATARAX ) tablet 25 mg  25 mg Oral TID PRN Coleman, Carolyn H, NP       lisinopril  (ZESTRIL ) tablet 5 mg  5 mg Oral Daily Chien, Stephanie, MD   5 mg at 09/17/23 0758   magnesium  hydroxide (MILK OF MAGNESIA) suspension 30 mL  30 mL Oral Daily PRN Coleman, Carolyn H, NP       metFORMIN  (GLUCOPHAGE -XR) 24 hr tablet 500 mg  500 mg Oral Daily Chien, Stephanie, MD   500 mg at 09/17/23 0759   methocarbamol  (ROBAXIN ) tablet 750 mg  750 mg Oral TID Jennett Tarbell S, MD   750 mg at 09/17/23 1610   oxyCODONE -acetaminophen  (PERCOCET/ROXICET) 5-325 MG per tablet 2 tablet  2 tablet Oral BID Grisel Blumenstock S, MD   2 tablet at 09/17/23 9604   pantoprazole  (PROTONIX ) EC tablet 40 mg  40 mg Oral BID Chien, Stephanie, MD   40 mg at 09/17/23 5409   traZODone  (DESYREL ) tablet 50 mg  50 mg Oral QHS PRN Costella Dirks, NP        Lab Results:  No results found for this or any previous visit (from the past 48 hours).   Blood Alcohol level:  Lab Results  Component Value Date   ETH <10 12/29/2022   ETH  05/31/2008    <5        LOWEST DETECTABLE LIMIT FOR SERUM ALCOHOL IS 5 mg/dL FOR MEDICAL PURPOSES ONLY    Metabolic Labs: Lab Results  Component Value Date   HGBA1C 4.9 09/15/2023   MPG 93.93 09/15/2023   No results found for: "PROLACTIN" Lab Results  Component Value Date   CHOL 140 09/15/2023   TRIG 79 09/15/2023   HDL 53 09/15/2023   CHOLHDL 2.6 09/15/2023   VLDL 16 09/15/2023   LDLCALC 71 09/15/2023   LDLCALC  05/31/2008    54        Total  Cholesterol/HDL:CHD Risk Coronary Heart Disease Risk Table                     Men   Women  1/2 Average Risk   3.4   3.3  Average Risk       5.0   4.4  2 X Average Risk   9.6   7.1  3 X Average Risk  23.4   11.0        Use the calculated Patient Ratio above and the CHD Risk Table to determine the patient's CHD Risk.        ATP III CLASSIFICATION (LDL):  <100     mg/dL   Optimal  811-914  mg/dL   Near or Above                    Optimal  130-159  mg/dL   Borderline  782-956  mg/dL   High  >213     mg/dL   Very High    Sleep: 6 hours   Physical Findings: AIMS: No  CIWA:    COWS:     Psychiatric Specialty Exam:  Presentation  General Appearance: Appropriate for Environment  Eye Contact:Fair  Speech:Clear and Coherent  Speech Volume:Normal  Mood and Affect  Mood:"Okay"  Affect:Congruent   Thought Process  Thought Processes:Coherent; Goal Directed  Descriptions of Associations:Intact  Orientation:Full (Time, Place and Person)  Thought Content:Logical  History of Schizophrenia/Schizoaffective disorder: None Duration of Psychotic Symptoms:N/A Hallucinations: None  Ideas of Reference:None  Suicidal Thoughts: None  Homicidal Thoughts: None   Sensorium  Memory:Immediate Fair  Judgment:Intact  Insight:Present   Executive Functions  Concentration:Fair  Attention Span:Fair  Recall:Fair  Fund of Knowledge:Fair  Language:Fair   Psychomotor Activity  Psychomotor Activity: Normal   Assets  Assets:Communication Skills; Desire for Improvement; Financial Resources/Insurance; Housing; Intimacy; Resilience  Sleep  Sleep: Good  Physical Exam ROS  Physical Exam Constitutional:      Appearance: the patient is not toxic-appearing.  Pulmonary:     Effort: Pulmonary effort is normal.  Neurological:     General: No focal deficit present.     Mental Status: the patient is alert and oriented to person, place, and time.   Review of  Systems  Respiratory:  Negative for shortness of breath.   Cardiovascular:  Negative for chest pain.  Gastrointestinal:  Negative for abdominal pain, constipation, diarrhea, nausea and vomiting.  Neurological:  Negative for headaches.   Blood pressure (!) 145/98, pulse 72, temperature 98.3 F (36.8 C), temperature source Oral, resp. rate 18, height 6\' 1"  (1.854 m), weight 98.9 kg, SpO2 98%. Body mass index is 28.76 kg/m.  Treatment Plan Summary: Daily contact with patient to assess and evaluate symptoms and progress in treatment, Medication management, and Plan    ASSESSMENT: [name] is a 53 y.o. male  with a past psychiatric history of none. Patient initially arrived to [facility] on May 9 after drinking 1 styrofoam cup of Clorox cleaner and feeling his chest burning and shortness of breath.  He was admitted to [facility] had an EGD on May 10 and was discharged on May 13 and admitted to [facility] under IVC on May 14 for acute safety concerns and acute suicidal or self-harming behaviors. PPHx is significant for none, and no history of Suicide Attempts, Self Injurious Behavior, or Prior Psychiatric Hospitalizations. PMHx is significant for chronic pain, HTN, prediabetes, multiple back surgeries  Patient continues to minimize depressive symptoms prior to arrival though acknowledges he may have been feeling depressed. Seems to have improving insight and looking into NA groups. He has slightly elevated BP, will start lisinopril  for his blood pressure  Diagnoses / Active Problems: Substance-induced mood disorder  Opioid use disorder  PLAN: Safety and Monitoring:  -- INVOLUNTARY admission to inpatient psychiatric unit for safety, stabilization and treatment  -- Daily contact with patient to assess and evaluate symptoms and progress in treatment  -- Patient's case to be discussed in multi-disciplinary team meeting  -- Observation Level : q15 minute checks  -- Vital signs:  q12  hours  -- Precautions: suicide, elopement, and assault  2. Psychiatric Diagnoses and Treatment:  -- Continue cymbalta  DR 60mg  for depressed mood with hopeful benefit to chronic pain  -- The risks/benefits/side-effects/alternatives to this medication were discussed in detail with the patient and time was given for questions. The patient consents to medication trial.              -- Metabolic profile and EKG monitoring obtained while on an atypical antipsychotic  BMI: Body mass index is 28.76 kg/m. TSH:  Lab Results  Component Value Date   TSH 1.320 09/15/2023   Lipid Panel:  Lab Results  Component Value Date   CHOL 140 09/15/2023   HDL 53 09/15/2023   LDLCALC 71 09/15/2023   TRIG 79 09/15/2023   CHOLHDL 2.6 09/15/2023   HbgA1c:  Lab Results  Component Value Date   HGBA1C 4.9 09/15/2023    QTc: 475 (5/15)             -- Encouraged patient to participate in unit milieu and in scheduled group therapies   Ability to identify changes in lifestyle to reduce recurrence of condition will improve, Ability to verbalize feelings will improve, Ability to disclose and discuss suicidal ideas, Ability to demonstrate self-control will improve, Ability  to identify and develop effective coping behaviors will improve, Ability to maintain clinical measurements within normal limits will improve, Compliance with prescribed medications will improve, and Ability to identify triggers associated with substance abuse/mental health issues will improve            -- Long Term Goals: Improvement in symptoms so as ready for discharge  Other PRNS:  acetaminophen , alum & mag hydroxide-simeth, haloperidol  **AND** diphenhydrAMINE , haloperidol  lactate **AND** diphenhydrAMINE  **AND** LORazepam , haloperidol  lactate **AND** diphenhydrAMINE  **AND** LORazepam , hydrOXYzine , magnesium  hydroxide, traZODone   -- As needed agitation protocol in-place   3. Medical Issues Being Addressed:   #Chronic pain -- Percocet 5 mg -  325 mg x 2 (patient takes Percocet 10 mg - 325 mg at home but we do not have this on the formulary) -- robaxin    #HTN Restarted home amlodipine   Increase lisinopril  to 10 mg on 5/17, and 20 mg on 5/18   #GERD, recent bleach ingestion --continue protonix  40mg  BID    #Prediabetes --continue home metformin   4. Discharge Planning:   -- Social work and case management to assist with discharge planning and identification of hospital follow-up needs prior to discharge  -- Estimated LOS: 5/18  -- Discharge Concerns: Need to establish a safety plan; Medication compliance and effectiveness  -- Discharge Goals: Return home with outpatient referrals for mental health follow-up including medication management/psychotherapy   I certify that inpatient services furnished can reasonably be expected to improve the patient's condition.    This note was created using a voice recognition software as a result there may be grammatical errors inadvertently enclosed that do not reflect the nature of this encounter. Every attempt is made to correct such errors.   Clair Crews, MD Psychiatrist  5/17/202510:18 AM

## 2023-09-17 NOTE — Group Note (Deleted)
 Date:  09/17/2023 Time:  10:43 AM  Group Topic/Focus:  Goals Group:   The focus of this group is to help patients establish daily goals to achieve during treatment and discuss how the patient can incorporate goal setting into their daily lives to aide in recovery. Orientation:   The focus of this group is to educate the patient on the purpose and policies of crisis stabilization and provide a format to answer questions about their admission.  The group details unit policies and expectations of patients while admitted.     Participation Level:  {BHH PARTICIPATION ZOXWR:60454}  Participation Quality:  {BHH PARTICIPATION QUALITY:22265}  Affect:  {BHH AFFECT:22266}  Cognitive:  {BHH COGNITIVE:22267}  Insight: {BHH Insight2:20797}  Engagement in Group:  {BHH ENGAGEMENT IN UJWJX:91478}  Modes of Intervention:  {BHH MODES OF INTERVENTION:22269}  Additional Comments:  ***  [name] 09/17/2023, 10:43 AM

## 2023-09-17 NOTE — Group Note (Signed)
 LCSW Group Therapy Note   Type of Therapy and Topic:  Group Therapy - Safety  Participation Level:  Active   Description of Group This process group involved patients discussing the situations or people in their lives that frequently make them safe or unsafe.  Anxiety was a common factor among all group participants and many of them described home situations that keep them on edge and not able to feel completely safe.  Three questions were addressed during the group:  (1) What makes you feel safe (or unsafe)?  (2) Do you feel safe with yourself and why?  (3) If you don't feel safe, what can you do?  A lengthy discussion ensued in which group members empathized with each other, gave suggestions to one another, and expressed their feelings freely.  Therapeutic Goals Patient will describe what makes them feel safe or unsafe in their everyday lives. Patient will think about and discuss whether they feel safe with themselves and what reasons might contribute to feeling safe or unsafe. Patients will participate in planning for what can be done to help themselves feel safer.   Summary of Patient Progress:  The patient share that his self worth is to be here for his family. The patient share that his goal in finding safety is to be able to protect himself from destructive people and situations.    Therapeutic Modalities Cognitive Behavioral Therapy  Summit Arroyave O Goldie Tregoning, LCSWA 09/17/2023  4:50 PM

## 2023-09-17 NOTE — Progress Notes (Signed)
   09/17/23 0530  15 Minute Checks  Location Bedroom  Visual Appearance Calm  Behavior Sleeping  Sleep (Behavioral Health Patients Only)  Calculate sleep? (Click Yes once per 24 hr at 0600 safety check) Yes  Documented sleep last 24 hours 8

## 2023-09-17 NOTE — Plan of Care (Signed)
   Problem: Education: Goal: Mental status will improve Outcome: Progressing   Problem: Activity: Goal: Interest or engagement in activities will improve Outcome: Progressing

## 2023-09-17 NOTE — Plan of Care (Signed)
   Problem: Education: Goal: Emotional status will improve Outcome: Progressing Goal: Mental status will improve Outcome: Progressing   Problem: Activity: Goal: Interest or engagement in activities will improve Outcome: Progressing

## 2023-09-17 NOTE — BHH Group Notes (Signed)
 BHH Group Notes:  (Nursing/MHT/Case Management/Adjunct)  Date:  09/17/2023  Time:  2000  Type of Therapy:  Wrap up group  Participation Level:  Active  Participation Quality:  Appropriate, Attentive, Sharing, and Supportive  Affect:  Appropriate  Cognitive:  Alert  Insight:  Improving  Engagement in Group:  Engaged  Modes of Intervention:  Clarification, Education, and Support  Summary of Progress/Problems: Positive thinking and positive change were discussed.   Catharine Clock 09/17/2023, 8:59 PM

## 2023-09-17 NOTE — Group Note (Signed)
 Date:  09/17/2023 Time:  11:21 AM  Group Topic/Focus:  Emotional Education:   The focus of this group is to discuss what feelings/emotions are, and how they are experienced.    Participation Level:  Active  [name] 09/17/2023, 11:21 AM

## 2023-09-17 NOTE — Group Note (Signed)
 Date:  09/17/2023 Time:  10:40 AM  Group Topic/Focus:  Goals Group:   The focus of this group is to help patients establish daily goals to achieve during treatment and discuss how the patient can incorporate goal setting into their daily lives to aide in recovery. Orientation:   The focus of this group is to educate the patient on the purpose and policies of crisis stabilization and provide a format to answer questions about their admission.  The group details unit policies and expectations of patients while admitted.    Participation Level:  Active  Jadrian Bulman J Samaiyah Howes 09/17/2023, 10:40 AM

## 2023-09-17 NOTE — Progress Notes (Signed)
   09/17/23 2323  Psych Admission Type (Psych Patients Only)  Admission Status Voluntary  Psychosocial Assessment  Patient Complaints None  Eye Contact Fair  Facial Expression Animated  Affect Appropriate to circumstance  Speech Logical/coherent  Interaction Assertive  Motor Activity Other (Comment) (WDL)  Appearance/Hygiene In scrubs  Mood Pleasant  Thought Process  Coherency WDL  Content WDL  Delusions None reported or observed  Perception WDL  Hallucination None reported or observed  Judgment Impaired  Confusion None  Danger to Self  Current suicidal ideation? Denies  Danger to Others  Danger to Others None reported or observed

## 2023-09-17 NOTE — Progress Notes (Addendum)
 D. Pt has been pleasant during interactions, visible in the milieu, observed attending groups and interacting well with peers and staff. Pt currently denies SI/HI and AVH and doesn't appear to be responding to internal stimuli.  A. Labs and vitals monitored. Pt given and educated on medications. Pt supported emotionally and encouraged to express concerns and ask questions.   R. Pt remains safe with 15 minute checks. Will continue POC.    09/17/23 1500  Psych Admission Type (Psych Patients Only)  Admission Status Voluntary  Psychosocial Assessment  Patient Complaints None  Eye Contact Fair  Facial Expression Animated  Affect Appropriate to circumstance  Speech Logical/coherent  Interaction Assertive  Motor Activity  (level 3 observation)  Appearance/Hygiene In scrubs  Behavior Characteristics Cooperative;Calm  Mood Pleasant  Thought Process  Coherency WDL  Content WDL  Delusions None reported or observed  Perception WDL  Hallucination None reported or observed  Judgment Impaired  Confusion None  Danger to Self  Current suicidal ideation? Denies  Danger to Others  Danger to Others None reported or observed

## 2023-09-18 DIAGNOSIS — F322 Major depressive disorder, single episode, severe without psychotic features: Principal | ICD-10-CM

## 2023-09-18 MED ORDER — DULOXETINE HCL 60 MG PO CPEP
60.0000 mg | ORAL_CAPSULE | Freq: Every day | ORAL | 0 refills | Status: AC
Start: 2023-09-18 — End: ?

## 2023-09-18 MED ORDER — OXYCODONE-ACETAMINOPHEN 10-325 MG PO TABS: 1.0000 | ORAL_TABLET | Freq: Two times a day (BID) | ORAL | 0 refills | Status: AC

## 2023-09-18 MED ORDER — LISINOPRIL 20 MG PO TABS: 20.0000 mg | ORAL_TABLET | Freq: Every day | ORAL | 0 refills | Status: AC

## 2023-09-18 NOTE — Progress Notes (Signed)
 Discharge Note:   Wife to pick up patient and return to home.  Suicide prevention information given and discussed with patient who stated he understood and had no questions.  Patient denied SI and HI.  Denied A/V hallucinations.  Patient stated he appreciated all assistance received from [facility] staff.  Patient stated he received all his belongings.  All required discharge information given.

## 2023-09-18 NOTE — Progress Notes (Signed)
  [facility] Adult Case Management Discharge Plan :  Will you be returning to the same living situation after discharge:  Yes,  pt's residence with spouse At discharge, do you have transportation home?: Yes,  Spouse Do you have the ability to pay for your medications: Yes,  Patient has medical coverage  Release of information consent forms completed and in the chart;  Patient's signature needed at discharge.  Patient to Follow up at:  Follow-up Information     [facility] Go on 09/20/2023.   Why: Please go to this provider on 09/20/23 at 8:30 am for an assessment, to obtain therapy and medication management services.  They are open 24/7. Contact information: [address] Dominick Fries [place] [address] [phone]                 Next level of care provider has access to [facility] Link:no  Safety Planning and Suicide Prevention discussed: Yes,  Completed with Thedore Finn (Spouse)     Has patient been referred to the Quitline?: Patient does not use tobacco/nicotine products  Patient has been referred for addiction treatment: Yes, the patient will follow up with an outpatient provider for substance use disorder. Psychiatrist/APP: appointment made and Therapist: appointment made  Adriana Albany, LCSW 09/18/2023, 9:17 AM
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
## 2023-09-18 NOTE — Plan of Care (Signed)
   Problem: Education: Goal: Verbalization of understanding the information provided will improve Outcome: Progressing   Problem: Activity: Goal: Sleeping patterns will improve Outcome: Progressing

## 2023-09-18 NOTE — [facility] Suicide Risk Assessment (Signed)
 [facility] Discharge Suicide Risk Assessment   Principal Problem: MDD (major depressive disorder), severe (HCC)  Discharge Diagnoses: Principal Problem:   MDD (major depressive disorder), severe (HCC)      Total Time spent with patient: 40  Musculoskeletal: Strength & Muscle Tone: within normal limits Gait & Station: normal Patient leans: N/A   Psychiatric Specialty Exam:  Presentation  General Appearance: Appropriate for Environment  Eye Contact: Good  Speech: Clear and Coherent; Normal Rate  Speech Volume: Normal  Handedness: Right   Mood and Affect  Mood: Euthymic  Affect: Congruent   Thought Process  Thought Processes: Linear  Descriptions of Associations: Intact  Orientation: Full (Time, Place and Person)  Thought Content: Logical  History of Schizophrenia/Schizoaffective disorder: No data recorded Duration of Psychotic Symptoms: NA Hallucinations: Hallucinations: None  Ideas of Reference: None  Suicidal Thoughts: Suicidal Thoughts: No  Homicidal Thoughts: Homicidal Thoughts: No   Sensorium  Memory: Immediate Good  Judgment: Fair  Insight: Fair   Art therapist  Concentration: Good  Attention Span: Good  Recall: Good  Fund of Knowledge: Good  Language: Good   Psychomotor Activity  Psychomotor Activity: Psychomotor Activity: Normal   Assets  Assets: Communication Skills; Social Support; Housing; Leisure Time   Sleep  Sleep: Sleep: Good   Physical Exam: General: Sitting comfortably. NAD. HEENT: Normocephalic, atraumatic, MMM, EMOI Lungs: no increased work of breathing noted Heart: no cyanosis Abdomen: Non distended Musculoskeletal: FROM. No obvious deformities Skin: Warm, dry, intact. No rashes noted Neuro: No obvious focal deficits.  Gait and station are normal  Review of Systems  Constitutional: Negative.   HENT: Negative.    Eyes: Negative.   Respiratory: Negative.    Cardiovascular: Negative.    Gastrointestinal: Negative.   Genitourinary: Negative.   Skin: Negative.   Neurological: Negative.   Psychiatric/Behavioral:  Negative   Mental Status Per Nursing Assessment: NA  Demographic Factors:  Male  Loss Factors: Decline in physical health  Historical Factors: NA  Risk Reduction Factors:   Sense of responsibility to family, Religious beliefs about death, Living with another person, especially a relative, Positive social support, and Positive coping skills or problem solving skills  Continued Clinical Symptoms:  Previous psychiatric diagnoses and treatments  Cognitive Features That Contribute To Risk:  None  Suicide Risk:  Minimal: No identifiable suicidal ideation.  Patients presenting with no risk factors but with morbid ruminations; may be classified as minimal risk based on the severity of the depressive symptoms.    Follow-up Information     [facility]. Go on 09/20/2023.   Why: Please go to this provider on 09/20/23 at 8:30 am for an assessment, to obtain therapy and medication management services.  They are open 24/7. Contact information: [address] [place] [address] [phone]                  Plan Of Care/Follow-up recommendations:  Activity: as tolerated  Diet: heart healthy  Other: -Follow-up with your outpatient psychiatric provider -instructions on appointment date, time, and address (location) are provided to you in discharge paperwork.  -Take your psychiatric medications as prescribed at discharge - instructions are provided to you in the discharge paperwork  -Follow-up with outpatient primary care doctor and other specialists -for management of preventative medicine and chronic medical issues  -Testing: Follow-up with outpatient provider for any abnormal lab results (if any)  -If you are prescribed an atypical antipsychotic medication, we recommend that your outpatient psychiatrist follow routine  screening  for side effects within 3 months of discharge, including monitoring: AIMS scale, height, weight, blood pressure, fasting lipid panel, HbA1c, and fasting blood sugar.   -Recommend total abstinence from alcohol, tobacco, and other illicit drug use at discharge.   -If your psychiatric symptoms recur, worsen, or if you have side effects to your psychiatric medications, call your outpatient psychiatric provider, 911, [phone] or go to the nearest emergency department.  -If suicidal thoughts occur, immediately call your outpatient psychiatric provider, 911, [phone] or go to the nearest emergency department.   Clair Crews, MD 09/18/23 10:12 AM

## 2023-09-18 NOTE — Progress Notes (Signed)
   09/18/23 0530  15 Minute Checks  Location Bedroom  Visual Appearance Calm  Behavior Sleeping  Sleep (Behavioral Health Patients Only)  Calculate sleep? (Click Yes once per 24 hr at 0600 safety check) Yes  Documented sleep last 24 hours 8

## 2023-09-18 NOTE — Group Note (Signed)
 Date:  09/18/2023 Time:  9:24 AM  Group Topic/Focus:  Goals Group:   The focus of this group is to help patients establish daily goals to achieve during treatment and discuss how the patient can incorporate goal setting into their daily lives to aide in recovery. Orientation:   The focus of this group is to educate the patient on the purpose and policies of crisis stabilization and provide a format to answer questions about their admission.  The group details unit policies and expectations of patients while admitted.    Participation Level:  Active  Participation Quality:  Appropriate  Affect:  Appropriate  Cognitive:  Appropriate  Insight: Appropriate  Engagement in Group:  Engaged  Modes of Intervention:  Orientation  Additional Comments:  Goal is to discharge  [name] 09/18/2023, 9:24 AM

## 2023-10-14 NOTE — Discharge Instructions (Signed)
 GASTRIC BYPASS / SLEEVE  Home Care Instructions  These instructions are to help you care for yourself when you go home.  Call: If you have any problems. Call [phone] and ask for the surgeon on call If you have an emergency related to your surgery please use the ER at [facility].  Tell the ER staff that you are a new post-op gastric bypass or gastric sleeve patient   Signs and symptoms to report: Severe vomiting or nausea If you cannot handle clear liquids for longer than 1 day, call your surgeon  Abdominal pain which does not get better after taking your pain medication Fever greater than 100.4 F and chills Heart rate over 100 beats a minute Trouble breathing Chest pain  Redness, swelling, drainage, or foul odor at incision (surgical) sites  If your incisions open or pull apart Swelling or pain in calf (lower leg) Diarrhea (Loose bowel movements that happen often), frequent watery, uncontrolled bowel movements Constipation, (no bowel movements for 3 days) if this happens:  Take Milk of Magnesia, 2 tablespoons by mouth, 3 times a day for 2 days if needed Stop taking Milk of Magnesia once you have had a bowel movement Call your doctor if constipation continues Or Take Miralax  (instead of Milk of Magnesia) following the label instructions Stop taking Miralax once you have had a bowel movement Call your doctor if constipation continues Anything you think is "abnormal for you"   Normal side effects after surgery: Unable to sleep at night or unable to concentrate Irritability Being tearful (crying) or depressed These are common complaints, possibly related to your anesthesia, stress of surgery and change in lifestyle, that usually go away a few weeks after surgery.  If these feelings continue, call your medical doctor.  Wound Care: You may have surgical glue, steri-strips, or staples over your incisions after surgery Surgical glue:  Looks like a clear film over your incisions  and will wear off a little at a time Steri-strips : Adhesive strips of tape over your incisions. You may notice a yellowish color on the skin under the steri-strips. This is used to make the   steri-strips stick better. Do not pull the steri-strips off - let them fall off Staples: Staples may be removed before you leave the hospital If you go home with staples, call [facility] at for an appointment with your surgeon's nurse to have staples removed 10 days after surgery, [phone] Showering: You may shower two (2) days after your surgery unless your surgeon tells you differently Wash gently around incisions with warm soapy water , rinse well, and gently pat dry  If you have a drain (tube from your incision), you may need someone to hold this while you shower  No tub baths until staples are removed and incisions are healed     Medications: Medications should be liquid or crushed if larger than the size of a dime Extended release pills (medication that releases a little bit at a time through the day) should not be crushed Depending on the size and number of medications you take, you may need to space (take a few throughout the day)/change the time you take your medications so that you do not over-fill your pouch (smaller stomach) Make sure you follow-up with your primary care physician to make medication changes needed during rapid weight loss and life-style changes If you have diabetes, follow up with the doctor that orders your diabetes medication(s) within one week after surgery and check  your blood sugar regularly. Do not drive while taking narcotics (pain medications) DO NOT take NSAID'S (Examples of NSAID's include ibuprofen, naproxen )  Diet:                    First 2 Weeks  You will see the nutritionist about two (2) weeks after your surgery. The nutritionist will increase the types of foods you can eat if you are handling liquids well: If you have severe vomiting or nausea  and cannot handle clear liquids lasting longer than 1 day, call your surgeon  Protein Shake Drink at least 2 ounces of shake 5-6 times per day Each serving of protein shakes (usually 8 - 12 ounces) should have a minimum of:  15 grams of protein  And no more than 5 grams of carbohydrate  Goal for protein each day: Men = 80 grams per day Women = 60 grams per day Protein powder may be added to fluids such as non-fat milk or Lactaid milk or Soy milk (limit to 35 grams added protein powder per serving)  Hydration Slowly increase the amount of water  and other clear liquids as tolerated (See Acceptable Fluids) Slowly increase the amount of protein shake as tolerated   Sip fluids slowly and throughout the day May use sugar substitutes in small amounts (no more than 6 - 8 packets per day; i.e. Splenda)  Fluid Goal The first goal is to drink at least 8 ounces of protein shake/drink per day (or as directed by the nutritionist);  See handout from pre-op Bariatric Education Class for examples of protein shake/drink.   Slowly increase the amount of protein shake you drink as tolerated You may find it easier to slowly sip shakes throughout the day It is important to get your proteins in first Your fluid goal is to drink 64 - 100 ounces of fluid daily It may take a few weeks to build up to this 32 oz (or more) should be clear liquids  And  32 oz (or more) should be full liquids (see below for examples) Liquids should not contain sugar, caffeine, or carbonation  Clear Liquids: Water  or Sugar-free flavored water  (i.e. Fruit H2O, Propel) Decaffeinated coffee or tea (sugar-free) Crystal Lite, Wyler's Lite, Minute Maid Lite Sugar-free Jell-O Bouillon or broth Sugar-free Popsicle:   *Less than 20 calories each; Limit 1 per day  Full Liquids: Protein Shakes/Drinks + 2 choices per day of other full liquids Full liquids must be: No More Than 12 grams of Carbs per serving  No More Than 3 grams of Fat  per serving Strained low-fat cream soup Non-Fat milk Fat-free Lactaid Milk Sugar-free yogurt (Dannon Lite & Fit, Greek yogurt)      Vitamins and Minerals Start 1 day after surgery unless otherwise directed by your surgeon Bariatric Specific Complete Multivitamins Chewable Calcium  Citrate with Vitamin D -3 (Example: 3 Chewable Calcium  Plus 600 with Vitamin D -3) Take 500 mg three (3) times a day for a total of 1500 mg each day Do not take all 3 doses of calcium  at one time as it may cause constipation, and you can only absorb 500 mg  at a time  Do not mix multivitamins containing iron with calcium  supplements; take 2 hours apart  Menstruating women and those at risk for anemia (a blood disease that causes weakness) may need extra iron Talk with your doctor to see if you need more iron If you need extra iron: Total daily Iron recommendation (including Vitamins) is 50 to 100  mg Iron/day Do not stop taking or change any vitamins or minerals until you talk to your nutritionist or surgeon Your nutritionist and/or surgeon must approve all vitamin and mineral supplements   Activity and Exercise: It is important to continue walking at home.  Limit your physical activity as instructed by your doctor.  During this time, use these guidelines: Do not lift anything greater than ten (10) pounds for at least two (2) weeks Do not go back to work or drive until Designer, industrial/product says you can You may have sex when you feel comfortable  It is VERY important for male patients to use a reliable birth control method; fertility often increases after surgery  Do not get pregnant for at least 18 months Start exercising as soon as your doctor tells you that you can Make sure your doctor approves any physical activity Start with a simple walking program Walk 5-15 minutes each day, 7 days per week.  Slowly increase until you are walking 30-45 minutes per day Consider joining our BELT program. [phone] or email  [email or web address]   Special Instructions Things to remember:  Use your CPAP when sleeping if this applies to you, do not stop the use of CPAP unless directed by physician after a sleep study [facility] has a free Bariatric Surgery Support Group that meets monthly, the 3rd Thursday, 6 pm.  Please review discharge information for date and location of this meeting. It is very important to keep all follow up appointments with your surgeon, nutritionist, primary care physician, and behavioral health practitioner After the first year, please follow up with your bariatric surgeon and nutritionist at least once a year in order to maintain best weight loss results   [facility]: [phone] [facility]: [phone] Bariatric Nurse Coordinator: [phone]

## 2023-10-31 ENCOUNTER — Inpatient Hospital Stay (HOSPITAL_COMMUNITY): Admit: 2023-10-31 | Admitting: General Surgery

## 2023-10-31 SURGERY — CREATION, GASTRIC BYPASS, LAPAROSCOPIC, USING ROUX-EN-Y GASTROENTEROSTOMY, WITH HIATAL HERNIA REPAIR
Anesthesia: General

## 2024-02-02 ENCOUNTER — Ambulatory Visit: Admitting: Diagnostic Neuroimaging

## 2024-02-07 ENCOUNTER — Ambulatory Visit: Admitting: Neurology

## 2024-02-28 ENCOUNTER — Ambulatory Visit (HOSPITAL_BASED_OUTPATIENT_CLINIC_OR_DEPARTMENT_OTHER)
Admission: EM | Admit: 2024-02-28 | Discharge: 2024-02-28 | Disposition: A | Discharge status: Home / Self Care | Attending: Student | Admitting: Student

## 2024-02-28 ENCOUNTER — Encounter (HOSPITAL_BASED_OUTPATIENT_CLINIC_OR_DEPARTMENT_OTHER): Payer: Self-pay | Admitting: Emergency Medicine

## 2024-02-28 DIAGNOSIS — R591 Generalized enlarged lymph nodes: Secondary | ICD-10-CM | POA: Diagnosis not present

## 2024-02-28 NOTE — ED Triage Notes (Addendum)
 Pt c/o neck soreness x 2 weeks ago he thought it would get better that maybe he slept wrong but he states its not any better. Pt denies any injury to neck

## 2024-02-28 NOTE — ED Provider Notes (Signed)
 [name]    CSN: [id number] Arrival date & time: 02/28/24  1726      History   Chief Complaint Chief Complaint  Patient presents with   Neck Pain    HPI [name] is a 53 y.o. male presenting with neck pain. History DJD, OA. Pt c/o anterior neck soreness x 2 weeks ago he thought it would get better that maybe he slept wrong but he states its not any better. Pt denies any injury to neck.  He has been taking Tylenol  and ibuprofen  with only temporary relief.  He denies recent URI.  Denies sore throat, ear pain, cough, shortness of breath.  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 Mild pancolonic diverticulosis.    ESOPHAGOGASTRODUODENOSCOPY  07/08/2008   Mild gastritis. Minimal hiatal hernia.    HERNIA REPAIR Right 11/12/2019   Umbilical and Right inguinal Hernia repair with mesh   KNEE SURGERY Left 1998   Lower back surgery     2013 and 2018   LUMBAR SPINE SURGERY  09/08/2021   Lower Lumbar Discectomy and fusion   POLYPECTOMY     right knee surgery  2005   UPPER GASTROINTESTINAL ENDOSCOPY     VEIN LIGATION AND STRIPPING Bilateral    WISDOM TOOTH EXTRACTION          Home Medications    Prior to Admission medications   Medication Sig Start Date End Date Taking? Authorizing Provider  amLODipine  (NORVASC ) 10 MG tablet Take 10 mg by mouth daily. 03/19/22  Yes [provider]  lisinopril  (ZESTRIL ) 20 MG tablet Take 1 tablet (20 mg total) by mouth daily. 09/19/23 02/28/24 Yes Kennyth Starleen RAMAN, MD  methocarbamol  (ROBAXIN ) 750 MG tablet Take 750 mg by mouth 3 (three) times daily.   Yes [provider]  Cyanocobalamin  (VITAMIN B-12 SL) Place 5,000 mcg under the tongue once a week.    [provider]  DULoxetine  (CYMBALTA ) 60 MG capsule Take 1 capsule (60 mg total) by mouth at bedtime. 09/18/23   Kennyth Starleen RAMAN, MD  metFORMIN  (GLUCOPHAGE -XR) 500 MG 24 hr tablet Take 500 mg by mouth daily. 02/11/22   [provider]  pantoprazole  (PROTONIX ) 40 MG tablet Take 40 mg by mouth daily as needed (indigestion).    [provider]    Family History Family History  Problem Relation Age of Onset   Stomach cancer Father    Diabetes Father    Kidney disease Father    Hypertension Father    Esophageal cancer Neg Hx    Rectal cancer Neg Hx    Colon cancer Neg Hx  Social History Social History   Tobacco Use   Smoking status: Never   Smokeless tobacco: Never  Vaping Use   Vaping status: Never Used  Substance Use Topics   Alcohol use: Never   Drug use: Never     Allergies   Vancomycin    Review of Systems Review of Systems  Constitutional:  Negative for chills, fever and unexpected weight change.  Respiratory:  Negative for chest tightness and shortness of breath.   Cardiovascular:  Negative for chest pain and palpitations.  Gastrointestinal:  Negative for abdominal pain, diarrhea, nausea and vomiting.  Genitourinary:  Negative for decreased urine volume, difficulty urinating and frequency.  Musculoskeletal:  Positive for neck pain. Negative for arthralgias, back pain, gait problem, joint swelling,  myalgias and neck stiffness.  Skin:  Negative for wound.  Neurological:  Negative for dizziness, tremors, seizures, syncope, facial asymmetry, speech difficulty, weakness, light-headedness, numbness and headaches.     Physical Exam Triage Vital Signs ED Triage Vitals  Encounter Vitals Group     BP 02/28/24 1735 (!) 145/83     Girls Systolic BP Percentile --      Girls Diastolic BP Percentile --      Boys Systolic BP Percentile --      Boys Diastolic BP Percentile --      Pulse Rate 02/28/24 1735 73     Resp 02/28/24 1735 18     Temp 02/28/24 1735 98.1 F (36.7 C)     Temp Source 02/28/24 1735 Oral     SpO2 02/28/24 1735 98 %     Weight --      Height --      Head Circumference --      Peak Flow --      Pain Score 02/28/24 1734 9     Pain Loc --      Pain Education --      Exclude from Growth Chart --    No data found.  Updated Vital Signs BP (!) 145/83 (BP Location: Right Arm)   Pulse 73   Temp 98.1 F (36.7 C) (Oral)   Resp 18   SpO2 98%   Visual Acuity Right Eye Distance:   Left Eye Distance:   Bilateral Distance:    Right Eye Near:   Left Eye Near:    Bilateral Near:     Physical Exam Vitals reviewed.  Constitutional:      General: He is not in acute distress.    Appearance: Normal appearance. He is not ill-appearing.  HENT:     Head: Normocephalic and atraumatic.     Right Ear: Tympanic membrane, ear canal and external ear normal. No tenderness. No middle ear effusion. There is no impacted cerumen. Tympanic membrane is not perforated, erythematous, retracted or bulging.     Left Ear: Tympanic membrane, ear canal and external ear normal. No tenderness.  No middle ear effusion. There is no impacted cerumen. Tympanic membrane is not perforated, erythematous, retracted or bulging.     Nose: Nose normal. No congestion.     Mouth/Throat:     Mouth: Mucous membranes are moist.     Pharynx: Uvula midline. No oropharyngeal exudate or posterior oropharyngeal  erythema.     Tonsils: No tonsillar exudate.     Comments: Tonsils are small and not erythematous. Eyes:     Extraocular Movements: Extraocular movements intact.     Pupils: Pupils are equal, round, and reactive to light.  Neck:     Comments: There  is tenderness to palpation in the deep cervical lymph nodes bilaterally. Cardiovascular:     Rate and Rhythm: Normal rate and regular rhythm.     Heart sounds: Normal heart sounds.  Pulmonary:     Effort: Pulmonary effort is normal.     Breath sounds: Normal breath sounds. No decreased breath sounds, wheezing, rhonchi or rales.  Abdominal:     Palpations: Abdomen is soft.     Tenderness: There is no abdominal tenderness. There is no guarding or rebound.  Lymphadenopathy:     Cervical: Cervical adenopathy present.     Right cervical: Deep cervical adenopathy present. No superficial or posterior cervical adenopathy.    Left cervical: Deep cervical adenopathy present. No superficial or posterior cervical adenopathy.  Neurological:     General: No focal deficit present.     Mental Status: He is alert and oriented to person, place, and time.  Psychiatric:        Mood and Affect: Mood normal.        Behavior: Behavior normal.        Thought Content: Thought content normal.        Judgment: Judgment normal.      UC Treatments / Results  Labs (all labs ordered are listed, but only abnormal results are displayed) Labs Reviewed  CBC    EKG   Radiology No results found.  Procedures Procedures (including critical Sandoval time)  Medications Ordered in UC Medications - No data to display  Initial Impression / Assessment and Plan / UC Course  I have reviewed the triage vital signs and the nursing notes.  Pertinent labs & imaging results that were available during my Sandoval of the patient were reviewed by me and considered in my medical decision making (see chart for details).     Patient is a pleasant 53 year old male presenting with  deep cervical lymphadenopathy.  He is afebrile and nontachycardic.  He does not have tonsillitis, an ear infection, or lung infection.  He has not been acutely ill in the last month.  I will start by checking a CBC, and he verbalizes he will call his primary Sandoval tomorrow to schedule an appointment with them.  Final Clinical Impressions(s) / UC Diagnoses   Final diagnoses:  Lymphadenopathy     Discharge Instructions      -Tomorrow call your PCP and schedule an appt with them -We are testing a CBC lab to check for signs of infection. We will call with positive results in 2-3 days.  -You can take Tylenol  up to 1000 mg 3 times daily, and ibuprofen  up to 600 mg 3 times daily with food.  You can take these together, or alternate every 3-4 hours. -If symptoms worsen or change before you are able to see your PCP, please head to the ED.     ED Prescriptions   None    PDMP not reviewed this encounter.   Arlyss Leita BRAVO, PA-C 02/28/24 1815

## 2024-02-28 NOTE — Discharge Instructions (Addendum)
-  Tomorrow call your PCP and schedule an appt with them -We are testing a CBC lab to check for signs of infection. We will call with positive results in 2-3 days.  -You can take Tylenol  up to 1000 mg 3 times daily, and ibuprofen  up to 600 mg 3 times daily with food.  You can take these together, or alternate every 3-4 hours. -If symptoms worsen or change before you are able to see your PCP, please head to the ED.

## 2024-02-29 ENCOUNTER — Ambulatory Visit (HOSPITAL_COMMUNITY): Payer: Self-pay

## 2024-02-29 LAB — CBC
Hematocrit: 40.5 % (ref 37.5–51.0)
Hemoglobin: 14.7 g/dL (ref 13.0–17.7)
MCH: 34.3 pg — ABNORMAL HIGH (ref 26.6–33.0)
MCHC: 36.3 g/dL — ABNORMAL HIGH (ref 31.5–35.7)
MCV: 94 fL (ref 79–97)
Platelets: 413 x10E3/uL (ref 150–450)
RBC: 4.29 x10E6/uL (ref 4.14–5.80)
RDW: 12.8 % (ref 11.6–15.4)
WBC: 6 x10E3/uL (ref 3.4–10.8)

## 2024-04-30 ENCOUNTER — Other Ambulatory Visit (HOSPITAL_BASED_OUTPATIENT_CLINIC_OR_DEPARTMENT_OTHER): Payer: Self-pay

## 2024-04-30 ENCOUNTER — Ambulatory Visit (HOSPITAL_BASED_OUTPATIENT_CLINIC_OR_DEPARTMENT_OTHER)
Admission: EM | Admit: 2024-04-30 | Discharge: 2024-04-30 | Disposition: A | Discharge status: Home / Self Care | Attending: Family Medicine | Admitting: Family Medicine

## 2024-04-30 ENCOUNTER — Encounter (HOSPITAL_BASED_OUTPATIENT_CLINIC_OR_DEPARTMENT_OTHER): Payer: Self-pay | Admitting: Emergency Medicine

## 2024-04-30 DIAGNOSIS — R051 Acute cough: Secondary | ICD-10-CM | POA: Diagnosis not present

## 2024-04-30 LAB — POC COVID19/FLU A&B COMBO
Covid Antigen, POC: NEGATIVE
Influenza A Antigen, POC: NEGATIVE
Influenza B Antigen, POC: NEGATIVE

## 2024-04-30 MED ORDER — OSELTAMIVIR PHOSPHATE 75 MG PO CAPS
75.0000 mg | ORAL_CAPSULE | Freq: Two times a day (BID) | ORAL | 0 refills | Status: AC
  Filled 2024-04-30: qty 10, 5d supply, fill #0

## 2024-04-30 NOTE — ED Triage Notes (Signed)
 Pt c/o body aches, congestion, chills, coughing started yesterday. He was around his daughter that had both flu A and B

## 2024-04-30 NOTE — ED Provider Notes (Signed)
 " [name]    CSN: [id number] Arrival date & time: 04/30/24  0903      History   Chief Complaint No chief complaint on file.   HPI [name] is a 53 y.o. male.   Pt is a 53 year old male that presents with  c/o body aches, congestion, chills, coughing started yesterday. 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 Mild pancolonic diverticulosis.    ESOPHAGOGASTRODUODENOSCOPY  07/08/2008   Mild gastritis. Minimal hiatal hernia.    HERNIA REPAIR Right 11/12/2019   Umbilical and Right inguinal Hernia repair with mesh   KNEE SURGERY Left 1998   Lower back surgery     2013 and 2018   LUMBAR SPINE SURGERY  09/08/2021   Lower Lumbar Discectomy and fusion   POLYPECTOMY     right knee surgery  2005   UPPER GASTROINTESTINAL ENDOSCOPY     VEIN LIGATION AND STRIPPING Bilateral    WISDOM TOOTH EXTRACTION         Home Medications    Prior to Admission medications  Medication Sig Start Date End Date Taking? Authorizing Provider  DULoxetine  (CYMBALTA ) 60 MG capsule Take 1 capsule (60 mg total) by mouth at bedtime. 09/18/23   Yes Kennyth Starleen RAMAN, MD  lisinopril  (ZESTRIL ) 20 MG tablet Take 1 tablet (20 mg total) by mouth daily. 09/19/23 04/30/24 Yes Kennyth Starleen RAMAN, MD  metFORMIN  (GLUCOPHAGE -XR) 500 MG 24 hr tablet Take 500 mg by mouth daily. 02/11/22  Yes [provider]  methocarbamol  (ROBAXIN ) 750 MG tablet Take 750 mg by mouth 3 (three) times daily.   Yes [provider]  oseltamivir  (TAMIFLU ) 75 MG capsule Take 1 capsule (75 mg total) by mouth every 12 (twelve) hours. 04/30/24  Yes Tanique Matney A, FNP  amLODipine  (NORVASC ) 10 MG tablet Take 10 mg by mouth daily. 03/19/22   [provider]  Cyanocobalamin  (VITAMIN B-12 SL) Place 5,000 mcg under the tongue once a week.    [provider]  pantoprazole  (PROTONIX ) 40 MG tablet Take 40 mg by mouth daily as needed (indigestion).    [provider]    Family History Family History  Problem Relation Age of Onset   Stomach cancer Father    Diabetes Father    Kidney disease Father    Hypertension Father    Esophageal cancer Neg Hx    Rectal cancer Neg Hx    Colon cancer Neg Hx     Social History Social History[1]   Allergies   Vancomycin   Review of Systems Review of Systems  See HPI Physical Exam Triage Vital Signs ED Triage Vitals  Encounter Vitals Group     BP 04/30/24 0936 122/78     Girls Systolic BP Percentile --      Girls Diastolic BP Percentile --      Boys Systolic BP Percentile --      Boys Diastolic BP Percentile --      Pulse Rate 04/30/24 0936 73     Resp 04/30/24 0936 18     Temp 04/30/24 0936 97.9 F (36.6 C)     Temp Source 04/30/24 0936 Oral     SpO2 04/30/24 0936 96 %     Weight --      Height --      Head Circumference --      Peak Flow --      Pain Score 04/30/24 0935 0     Pain Loc --      Pain Education --      Exclude from Growth Chart --    No data found.  Updated Vital Signs BP 122/78 (BP Location: Right Arm)   Pulse 73   Temp 97.9 F (36.6 C) (Oral)   Resp 18    SpO2 96%   Visual Acuity Right Eye Distance:   Left Eye Distance:   Bilateral Distance:    Right Eye Near:   Left Eye Near:    Bilateral Near:     Physical Exam Constitutional:      General: He is not in acute distress.    Appearance: Normal appearance. He is not ill-appearing, toxic-appearing or diaphoretic.  Cardiovascular:     Rate and Rhythm: Normal rate and regular rhythm.  Pulmonary:     Effort: Pulmonary effort is normal.     Breath sounds: Normal breath sounds.  Musculoskeletal:        General: Normal range of motion.  Neurological:     Mental Status: He is alert.  Psychiatric:        Mood and Affect: Mood normal.      UC Treatments / Results  Labs (all labs ordered are listed, but only abnormal results are displayed) Labs Reviewed  POC COVID19/FLU A&B COMBO - Normal    EKG   Radiology No results found.  Procedures Procedures (including critical Sandoval time)  Medications Ordered in UC Medications - No data to display  Initial Impression / Assessment and Plan / UC Course  I have reviewed the triage vital signs and the nursing notes.  Pertinent labs & imaging results that were available during my Sandoval of the patient were reviewed by me and considered in my medical decision making (see chart for details).     Acute cough-flu and COVID testing negative.  May be too early to test.  Was exposed to flu A and B.  Recommended starting Tamiflu  at this time.  Patient agree.  Will start Tamiflu .  He can also take over-the-counter Mucinex, Tylenol  and Motrin  for symptoms as needed.  Rest, hydrate and follow-up as needed Final Clinical Impressions(s) / UC Diagnoses   Final diagnoses:  Acute cough     Discharge Instructions      Your flu test was negative. You may still have the flu. We can prescribe tamiflu . You can take OTC mucinex, tylenol , motrin  for symptoms. Drink plenty of fluids. Rest. Follow up with PCP as needed.      ED Prescriptions      Medication Sig Dispense Auth. Provider  oseltamivir  (TAMIFLU ) 75 MG capsule Take 1 capsule (75 mg total) by mouth every 12 (twelve) hours. 10 capsule Adah Wilbert LABOR, FNP      PDMP not reviewed this encounter.    [1]  Social History Tobacco Use   Smoking status: Never   Smokeless tobacco: Never  Vaping Use   Vaping status: Never Used  Substance Use Topics   Alcohol use: Never   Drug use: Never     Adah Wilbert LABOR, FNP 04/30/24 1018  "

## 2024-04-30 NOTE — Discharge Instructions (Signed)
 Your flu test was negative. You may still have the flu. We can prescribe tamiflu . You can take OTC mucinex, tylenol , motrin  for symptoms. Drink plenty of fluids. Rest. Follow up with PCP as needed.

## 2024-07-04 ENCOUNTER — Ambulatory Visit: Admitting: Cardiology
